# Patient Record
Sex: Female | Born: 1940 | Race: Black or African American | Hispanic: No | Marital: Married | Smoking: Never smoker
Health system: Southern US, Community
[De-identification: ages and names within clinical notes are randomized; demographics above are authoritative.]

## PROBLEM LIST (undated history)

## (undated) DIAGNOSIS — E669 Obesity, unspecified: Secondary | ICD-10-CM

## (undated) DIAGNOSIS — R569 Unspecified convulsions: Secondary | ICD-10-CM

## (undated) DIAGNOSIS — E785 Hyperlipidemia, unspecified: Secondary | ICD-10-CM

## (undated) DIAGNOSIS — I10 Essential (primary) hypertension: Secondary | ICD-10-CM

## (undated) DIAGNOSIS — Z90710 Acquired absence of both cervix and uterus: Secondary | ICD-10-CM

## (undated) DIAGNOSIS — R011 Cardiac murmur, unspecified: Secondary | ICD-10-CM

## (undated) DIAGNOSIS — E0590 Thyrotoxicosis, unspecified without thyrotoxic crisis or storm: Secondary | ICD-10-CM

## (undated) DIAGNOSIS — I519 Heart disease, unspecified: Secondary | ICD-10-CM

## (undated) HISTORY — DX: Cardiac murmur, unspecified: R01.1

## (undated) HISTORY — PX: ABDOMINAL HYSTERECTOMY: SHX81

## (undated) HISTORY — DX: Heart disease, unspecified: I51.9

## (undated) HISTORY — DX: Acquired absence of both cervix and uterus: Z90.710

---

## 1998-03-05 ENCOUNTER — Other Ambulatory Visit: Admission: RE | Admit: 1998-03-05 | Discharge: 1998-03-05 | Payer: Self-pay | Admitting: Family Medicine

## 2000-04-06 ENCOUNTER — Ambulatory Visit (HOSPITAL_COMMUNITY): Admission: RE | Admit: 2000-04-06 | Discharge: 2000-04-06 | Payer: Self-pay | Admitting: *Deleted

## 2000-04-06 ENCOUNTER — Encounter: Payer: Self-pay | Admitting: *Deleted

## 2000-05-18 ENCOUNTER — Encounter (INDEPENDENT_AMBULATORY_CARE_PROVIDER_SITE_OTHER): Payer: Self-pay

## 2000-05-18 ENCOUNTER — Inpatient Hospital Stay (HOSPITAL_COMMUNITY): Admission: RE | Admit: 2000-05-18 | Discharge: 2000-05-21 | Payer: Self-pay | Admitting: *Deleted

## 2001-02-07 DIAGNOSIS — Z90710 Acquired absence of both cervix and uterus: Secondary | ICD-10-CM

## 2001-02-07 HISTORY — DX: Acquired absence of both cervix and uterus: Z90.710

## 2001-09-02 ENCOUNTER — Encounter: Payer: Self-pay | Admitting: Family Medicine

## 2001-09-02 ENCOUNTER — Inpatient Hospital Stay (HOSPITAL_COMMUNITY): Admission: EM | Admit: 2001-09-02 | Discharge: 2001-09-11 | Payer: Self-pay

## 2001-09-03 ENCOUNTER — Encounter: Payer: Self-pay | Admitting: Family Medicine

## 2001-10-25 ENCOUNTER — Encounter: Payer: Self-pay | Admitting: Neurology

## 2001-10-25 ENCOUNTER — Ambulatory Visit (HOSPITAL_COMMUNITY): Admission: RE | Admit: 2001-10-25 | Discharge: 2001-10-25 | Payer: Self-pay | Admitting: Neurology

## 2004-03-31 ENCOUNTER — Other Ambulatory Visit: Admission: RE | Admit: 2004-03-31 | Discharge: 2004-03-31 | Payer: Self-pay | Admitting: Family Medicine

## 2005-11-07 ENCOUNTER — Ambulatory Visit (HOSPITAL_COMMUNITY): Admission: RE | Admit: 2005-11-07 | Discharge: 2005-11-07 | Payer: Self-pay | Admitting: Family Medicine

## 2010-02-24 ENCOUNTER — Emergency Department (HOSPITAL_COMMUNITY)
Admission: EM | Admit: 2010-02-24 | Discharge: 2010-02-24 | Payer: Self-pay | Source: Home / Self Care | Admitting: Emergency Medicine

## 2010-04-12 ENCOUNTER — Emergency Department (HOSPITAL_COMMUNITY)
Admission: EM | Admit: 2010-04-12 | Discharge: 2010-04-12 | Disposition: A | Discharge status: Home / Self Care | Payer: Medicare Other | Attending: Emergency Medicine | Admitting: Emergency Medicine

## 2010-04-12 DIAGNOSIS — I10 Essential (primary) hypertension: Secondary | ICD-10-CM | POA: Insufficient documentation

## 2010-04-12 DIAGNOSIS — Z7982 Long term (current) use of aspirin: Secondary | ICD-10-CM | POA: Insufficient documentation

## 2010-04-12 DIAGNOSIS — L0231 Cutaneous abscess of buttock: Secondary | ICD-10-CM | POA: Insufficient documentation

## 2010-04-12 DIAGNOSIS — G40909 Epilepsy, unspecified, not intractable, without status epilepticus: Secondary | ICD-10-CM | POA: Insufficient documentation

## 2010-06-25 NOTE — Discharge Summary (Signed)
NAME:  Cassandra Jones, Cassandra Jones                          ACCOUNT NO.:  [id number]   MEDICAL RECORD NO.:  [id number]                   PATIENT TYPE:  INP   LOCATION:  4730                                 FACILITY:  [facility]   PHYSICIAN:  Lorelle Formosa, M.D.           DATE OF BIRTH:  [birth year]   DATE OF ADMISSION:  09/01/2001  DATE OF DISCHARGE:  09/11/2001                                 DISCHARGE SUMMARY   ADMISSION DIAGNOSES:  1. Syncopal episode.  2. Hypertension.  3. Hypokalemia.   DISCHARGE DIAGNOSES:  1. Central cord compression syndrome.  2. Hypertension.  3. Seizure prophylaxis after collapse.   CONDITION ON DISCHARGE:  Stable.   DISPOSITION:  The patient was discharged home.   CONSULTANTS:  Neurology, Marlan Palau, M.D.   DISCHARGE MEDICATIONS:  1. Dilantin 100 mg t.i.d.  2. Reserpine 0.25 mg daily.  3. Hydrochlorothiazide 25 mg daily.  4. Vicodin one q.i.d. p.r.n. pain.   FOLLOW-UP:  Follow up in one to two weeks in the office.   ACTIVITY:  The patient is not to work for at least one month until October 12, 2001, pending further disposition.   HISTORY OF PRESENT ILLNESS:  This patient is a 70 year old, black, married  woman who came into the emergency department after a spell where she fell in  her kitchen.  She did not slip in any water, but had a misstep.  She was not  witnessed to fall, but was heard by her husband, who assisted her up.  The  patient states that she was out for a few seconds.  Family members denied  loss of bowel or bladder function.  She did not have any lesions of her  tongue.  She did complain of some discomfort in her right arm.  She had no  had any fever, chills, shakes, or any other untoward type of occurrence.  The patient was functionally feeling fine just prior to that, but did notice  that during the previous week or so she had felt some strangeness.  The  patient thus was brought to the emergency room and had a witnessed  seizure  for which she was given Ativan.  A CT scan of the head was done with no  apparent findings.  The patient was not able to voluntarily move her upper  extremities and seemed to have quite a bit of pain in the proximal upper  extremities and shoulder areas with passive movement.  This was felt to be  due to the fall initially and was monitored.   PAST MEDICAL HISTORY:  Hypertension with the use of reserpine 0.25 mg and  hydrochlorothiazide 25 mg daily.   ALLERGIES:  She has no known allergies.   FAMILY HISTORY:  Unremarkable and as outlined in the admission history and  physical.  Other past medical history was unremarkable and as outlined in  the admission history and physical.  SOCIAL HISTORY:  Unremarkable and as outlined in the admission history and  physical.   REVIEW OF SYMPTOMS:  Unremarkable and as outlined in the admission history  and physical.   PHYSICAL EXAMINATION:  VITAL SIGNS:  Blood pressure 104/78, pulse 77,  respirations 20, temperature 97.2 degrees.  O2 saturations were initially  95% on room air.  HEIGHT:  5 feet 2 inches.  WEIGHT:  210.6 pounds.  GENERAL APPEARANCE:  The patient was somewhat somnolent, but easily  arousable and oriented.  HEENT:  PERRLA.  EOMs were intact.  Mucous membranes were moist.  NECK:  Supple without adenopathy, jugular venous distention ______.  LUNGS:  Clear to auscultation.  HEART:  Regular rate.  No murmur.  ABDOMEN:  Soft and flat.  No masses.  EXTREMITIES:  Rather impressive tenderness to palpation of her shoulders  bilaterally in the proximal regions and decreased range of motion secondary  to pain.  Pulses were symmetrical.  NEUROLOGIC:  Nonfocal.   LABORATORY DATA:  The CBC revealed a white count of 12.2, hemoglobin 12.7,  hematocrit 40, MCV 76.9, platelets 226,000, 79 neutrophils, 13 lymphs, 7  monocytes, and 1 eosinophil.  A repeat CBC revealed white count increased to  16.4 with a hemoglobin of 12.3, and  hematocrit 38.3.  The initial TSH and  blood gases were normal.  Chemistries were initially normals of potassium  3.0 and glucose 138.  The initial CK was 329 with CK-MB 5.4.  Repeat CKs  were not significantly remarkable, but about the same.  The phenytoin was  initially less than 2.5 with repeat 11.7 and 8.2.  The urinalysis had  leukocyte esterase with a few epithelial cells and 3-6 white blood count and  a few bacteria.  The glycosylated hemoglobin was 5.5.  The initial x-ray of  the right humerus revealed mild degenerative changes of the [facility] joint with no  definite acute bony abnormality.  CT of the head without contrast showed no  definite evidence of acute intracranial abnormality.  The initial CT  revealed no acute abnormality also with suggestion that acute infarct could  be missed for 24-48 hours.  Thus, it was repeated without change.  MRI of  the brain and cervical spine revealed central disk extrusion at C6-7 with  cord flattening.  The central diameter of the spinal canal measured between  8-9 mm.  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  Biventricular enlargement was suggested with early atrophy  mostly of the fourth ventricle.  No other significant small vessel disease,  acute infarction, or abnormal intracranial enhancement.   HOSPITAL COURSE:  The patient was admitted to the hospital and seen by a  neurologist, C. Lesia Sago, M.D.  Her course was monitored with some  perplexity regarding the cause of her shoulder pain and her failure to  resolve.  The patient was unable to move her upper extremities to raise her  arms with upper lateral weakness and pain.  This did not resolve with  analgesics and rest.  She was thus thought to have a component of central  cord compression and physical therapy was given.  The patient will have this continued on an outpatient  basis.  She was able to walk fine with no  ________ type of change.  She was placed on Dilantin as therapeutic levels  were ascertained.  She was  given monitoring of elevated CBGs, which were thought  to be due to Decadron.  The hemoglobin A1C suggested this was not due to diabetes as she had no  history of such.  The patient is thus discharged for outpatient management.  She did have an EKG done which was normal.                                               Lorelle Formosa, M.D.    WWM/MEDQ  D:  11/01/2001  T:  11/04/2001  Job:  [id number]

## 2010-06-25 NOTE — Op Note (Signed)
[facility]  Patient:    Cassandra, Jones                       MRN: [id number] Proc. Date: 05/18/00 Adm. Date:  [id number] Attending:  Deniece Ree                           Operative Report  PREOPERATIVE DIAGNOSES:       Leiomyomatous uteri; chronic uterine                               pain; possible adenomyosis; pelvic                               adhesions.  POSTOPERATIVE DIAGNOSES:      Leiomyomatous uteri, chronic uterine                               pain, possible adenomyosis, pelvic                               adhesions, pending pathology.  OPERATION:                    Total abdominal hysterectomy and                               bilateral salpingo-oophorectomy with                               lysis of adhesions.  SURGEON:                      Deniece Ree, M.D.  ASSISTANT:                    Kathreen Cosier, M.D.  ANESTHESIA:                   General.  ESTIMATED BLOOD LOSS:         300 cc.  DRAINS/TUBES:                 Foley to straight drainage.  CONDITION ON DISCHARGE:       The patient tolerated the procedure well and                               returned to the recovery room in satisfactory                               condition.  DESCRIPTION OF PROCEDURE:     The patient was taken to the operating room and prepped and draped in the usual fashion for abdominal surgery. A low Pfannenstiel skin incision was made which was carried down to the peritoneum. After entering the fascia, the fascia was excised bilaterally the extent of the incision. The midline was identified and the rectus muscles opened. The abdominal peritoneum was then entered in a vertical fashion using Metzenbaum scissors. Immediately upon entering the abdominal pelvic cavity,  a moderate amount of adhesions were present. These were bluntly and sharply dissected away. At this point the uterus was grasped and elevated, which appeared to  be approximately 8-10 weeks in size with myomas present. The left round ligament was then grasped and ligated in routine fashion using #1 chromic in a Heaney stitch. This was done likewise on the right side. The infundibulopelvic ligament was then doubly clamped and ligated again using #1 chromic in Heaney stitches. The uterine vessels were then located, doubly clamped, doubly ligated again using #1 chromic in Heaney stitches. Straight Kocher clamps were then used to grasp the cardinal and uterosacral ligaments. This was done likewise again using #1 chromic in Heaney stitches. The vaginal cuff was then entered and the uterus separated from the vaginal cuff using Satinsky scissors. Hemostasis was obtained. Richardson angled stitches were put in bilaterally for vaginal support, following which the cuff was closed utilizing #1 chromic in a running locking stitch. At this point reperitonealization was carried out using 2-0 chromic in a running stitch. Sponge, needle, lap and instrument counts were correct x 2. Hemostasis was present. Irrigation then was also carried out. At this point the abdominal peritoneum was closed using 2-0 chromic in a running stitch, followed by closure of the fascia using #1 Dexon in a running stitch. 4-0 Monocryl was then utilized for the skin in a closure utilizing a subcuticular stitch. At this point the procedure was terminated. The patient tolerated the procedure well and returned to the recovery room in satisfactory condition. DD:  05/18/00 TD:  05/18/00 Job: [id number]

## 2010-06-25 NOTE — Discharge Summary (Signed)
[facility]  Patient:    MIKELLA, LINSLEY                       MRN: [id number] Adm. Date:  [id number] Disc. Date: [id number] Attending:  Deniece Ree                           Discharge Summary  DISCHARGE DIAGNOSES:          Leiomyomata uteri and chronic uterine pain.  DISCHARGE OPERATION:          Total abdominal hysterectomy and bilateral salpingo-oophorectomy.  DISCHARGE SUMMARY:            Patient is a 70 year old female who had been evaluated on several occasions for chronic uterine pain.  The patient began having more severe chronic pain and without any help from her pain relievers. She was scheduled for a total abdominal hysterectomy and a bilateral salpingo-oophorectomy.  On the day of admission patient underwent a total abdominal hysterectomy and a bilateral salpingo-oophorectomy.  Postoperative this patient did very well without any complications and was discharged on the third postoperative day.  She was instructed on the possible complications and care following this type of surgery.  She was told to return to my office in four weeks for followup evaluation or to call me prior to that time should any problems arise.DD:  07/15/00 TD:  07/16/00 Job: [id number]

## 2010-06-25 NOTE — Procedures (Signed)
EEG NUMBER:  [id number]   HISTORY:  This is a 70 year old with history of seizures, who is having an  EMG done to evaluate for seizure activity.   PROCEDURE:  This is a routine EEG.   TECHNICAL DESCRIPTION:  Throughout this routine EEG, there is a posterior-  dominant rhythm of 10-Hz activity at 20-30 mV.  Background activity is  symmetric and mostly comprised of alpha-range activity at 15-40 mV.  With  photic stimulation, there is a symmetric photic driving response noted.  Hyperventilation does not produce any significant abnormalities.  The  patient does become drowsy, however, does not enter stage II sleep.  Throughout this record, there is no evidence of electrographic seizures or  interictal discharge activity.   IMPRESSION:  This routine EEG is within normal limits in the awake state.      Cassandra Jones. Nash Shearer, M.D.  Electronically Signed     EAV:[id number]  D:  11/07/2005 12:04:06  T:  11/08/2005 08:47:57  Job #:  [id number]

## 2010-06-25 NOTE — H&P (Signed)
[facility]  Patient:    Cassandra Jones, Cassandra Jones                       MRN: [id number] Adm. Date:  [id number] Attending:  Deniece Ree                         History and Physical  HISTORY:                      The patient is a 70 year old female who has been evaluated on several occasions by me for chronic uterine pain.  Patient indicates that she has chronic pain most of the time.  She has utilized pain relievers and also antibiotics for possible infections.  She also has leiomyomata uteri and at this time desires surgical intervention and is being admitted for a total abdominal hysterectomy and bilateral salpingo-oophorectomy.  PAST MEDICAL HISTORY:         Three vaginal deliveries and also a bilateral tubal ligation.  Medically the patient is physically fit, however, is on antihypertensive medication.  PHYSICAL EXAMINATION  GENERAL:                      Well-developed, well-nourished, slightly obese female in no acute distress.  HEENT:                        Within normal limits.  NECK:                         Supple.  BREASTS:                      Without masses, tenderness, or discharge.  LUNGS:                        Clear to percussion, auscultation.  HEART:                        Normal sinus rhythm without murmur, rub, or gallop.  ABDOMEN:                      Obese, however, benign.  EXTREMITIES:                  Within normal limits.  NEUROLOGIC:                   Within normal limits.  PELVIC:                       External genitalia, BUS is within normal limits. Vagina is clear.  Cervix is firm and nontender.  The uterus is approximately 10 weeks in size, several nodularities around it.  On palpation the uterus is moderately to severely tender.  The adnexa is benign.  DIAGNOSES:                    Leiomyomata uteri approximately 8-10 weeks size, chronic uterine pain, possibly pelvic adhesions.  PLAN:                          Total abdominal hysterectomy and bilateral salpingo-oophorectomy. DD:  05/18/00 TD:  05/18/00 Job: [id number]

## 2010-06-25 NOTE — Consult Note (Signed)
NAME:  Cassandra Jones, Cassandra Jones                          ACCOUNT NO.:  [id number]   MEDICAL RECORD NO.:  [id number]                   PATIENT TYPE:  INP   LOCATION:  4730                                 FACILITY:  [facility]   PHYSICIAN:  Marlan Palau, M.D.               DATE OF BIRTH:  1940-08-18   DATE OF CONSULTATION:  DATE OF DISCHARGE:                              NEUROLOGY CONSULTATION   HISTORY OF PRESENT ILLNESS:  The patient is a 70 year old right-handed black  female born 12/18/1940, with a history of obesity, hypertension, who  is admitted at this point with new-onset seizure events.  The patient had a  seizure event at home that was unwitnessed.  The patient's husband was  outside, heard a thump and came back in.  The patient had apparently fallen  across the table, hitting on her right arm.  The patient was brought to the  emergency room and had another witnessed seizure-type event in the emergency  room.  The patient underwent a CT scan of the brain that was unremarkable.  Due to significant arm pain, cervical and thoracic spine x-rays were done  which have shown no evidence of fracture.  The patient was admitted for  evaluation but continues to complain of some pain, possible weakness of both  arms.  The patient otherwise denies any problems with colon, bowels, or  bladder.  Denies any neck pain per se.  The patient recalls nothing of the  seizure-type events.  The patient denied any tongue biting or bowel or  bladder incontinence.  Neurology was asked to see this patient for further  evaluation.   PAST MEDICAL HISTORY:  1. History of seizure event on admission.  2. Obesity.  3. Hypertension.  4. Upper extremity weakness greater than lower extremity weakness.  Severe     soreness of both upper extremities.  5. History of hysterectomy.  6. History of hypercholesterolemia.  7. History of anemia, on iron replacement.   MEDICATIONS:  The patient has been treated with  reserpine 0.25 mg daily and  hydrochlorothiazide 25 mg a day prior to admission.   ALLERGIES:  No known allergies.   SOCIAL HISTORY:  Does not smoke or drink.  This patient is married.  Lives  in the [place] area.  Works for [employer].  Has three children who are  alive and well.   FAMILY HISTORY:  Mother died in childbirth.  Father died with hypertension.  Has 18 brothers and sisters.  One brother died with a cerebral aneurysm.  One brother died with cancer of the throat.  One brother died with  alcoholism.  One brother died following a motor vehicle accident.  One  sister died from childbirth.   REVIEW OF SYMPTOMS:  Notable for no fever or chills.  The patient denies  headache, visual field changes, shortness of breath, chest pains, nausea,  vomiting.  Denies prior  syncope or seizures.  Has had occasional  palpitations of the heart associated with coffee intake.   PHYSICAL EXAMINATION:  VITAL SIGNS:  Blood pressure is 104/60, heart rate is 62, respiratory rate  20, temperature afebrile.   GENERAL:  This patient is a moderately to markedly obese black female who is  alert and cooperative at the time of examination.   HEENT:  Head is atraumatic.  Eyes:  Pupils are equal, round, and reactive to  light.  Discs are flat bilaterally.   NECK:  Supple.  No carotid bruits noted.   LUNGS:  Clear.   CARDIOVASCULAR:  Distant heart sounds.  No obvious murmurs or rubs noted.   EXTREMITIES:  Without significant edema.   NEUROLOGIC:  Cranial nerves as above.  Facial symmetry is present.  The  patient has good sensation to the face, pinprick and soft touch bilaterally.  Has good strength to facial muscles and the muscles of head turning and  shoulder shrugging bilaterally.  Speech is well enunciated and not aphasic.  Motor testing reveals weakness primarily with the distal muscles of the arms  bilaterally with grip, abduction of the fingers and thumbs bilaterally.  Triceps muscles  seem to be a bit weak.  Biceps seem to be stronger.  The  patient, however, had a very difficult examination with severe s with even  passive movement of the arms and a lot of give-away-type weakness due to  pain.  The lower extremities appear to have some weakness with lifting the  legs up off the bed with proximal thighs.  Otherwise, strength in the lower  extremities is symmetric and normal.  The patient has absent triceps  reflexes bilaterally and ankle jerk reflexes bilaterally, otherwise has  normal reflexes.  Toes are neutral, downgoing bilaterally.  The patient is  unable to perform finger-nose-finger or toe-to-finger due to pain and/or  weakness.  Pinprick, soft touch, sensation of extremities is symmetric and  normal.   LABORATORY DATA:  Before urinalysis, the specific gravity was 1.015, pH 6.0,  3-6 white cells.  CK level 329, troponin I 0.02.  White count of 12.2,  hemoglobin of 12.7, platelets 262, MCV of 76.9.  Sodium 136, potassium 3.9,  chloride of 102, BUN of 15,  glucose of 138.   IMPRESSION:  1. History of new-onset seizure, etiology unclear.  2. Bilateral upper extremity pain, possible weakness.  Also some weakness     involving the lower extremities, rule out central cord syndrome.  3. Cervical spine evaluation shows no fracture.  The patient will need to be     evaluated, however, for ruptured cervical disk with central cord     syndrome.  Will pursue further workup at this point.   PLAN:  1. MRI scan of the cervical spine on an urgent basis tonight.  2. Decadron therapy.  3. Cervical collar.  4. MRI scan of the brain in the a.m. with and without gadolinium.  5. EEG study.  6.     Initiate Dilantin therapy.  Cannot afford to have a seizure if cervical     spine disease is present.  7. Will follow up with muscle enzymes level in a.m.  8. Will follow the patient's clinical course while in house.  Marlan Palau,  M.D.    CKW/MEDQ  D:  09/03/2001  T:  09/08/2001  Job:  [id number]   cc:   Lorelle Formosa, M.D.

## 2014-04-28 DIAGNOSIS — I10 Essential (primary) hypertension: Secondary | ICD-10-CM | POA: Diagnosis not present

## 2014-04-28 DIAGNOSIS — G40009 Localization-related (focal) (partial) idiopathic epilepsy and epileptic syndromes with seizures of localized onset, not intractable, without status epilepticus: Secondary | ICD-10-CM | POA: Diagnosis not present

## 2014-05-22 DIAGNOSIS — Z1211 Encounter for screening for malignant neoplasm of colon: Secondary | ICD-10-CM | POA: Diagnosis not present

## 2014-05-22 DIAGNOSIS — K5900 Constipation, unspecified: Secondary | ICD-10-CM | POA: Diagnosis not present

## 2014-07-03 ENCOUNTER — Emergency Department (HOSPITAL_COMMUNITY)
Admission: EM | Admit: 2014-07-03 | Discharge: 2014-07-03 | Disposition: A | Discharge status: Home / Self Care | Payer: Commercial Managed Care - HMO | Attending: Emergency Medicine | Admitting: Emergency Medicine

## 2014-07-03 ENCOUNTER — Encounter (HOSPITAL_COMMUNITY): Payer: Self-pay | Admitting: Emergency Medicine

## 2014-07-03 DIAGNOSIS — L03221 Cellulitis of neck: Secondary | ICD-10-CM

## 2014-07-03 DIAGNOSIS — Z8639 Personal history of other endocrine, nutritional and metabolic disease: Secondary | ICD-10-CM | POA: Insufficient documentation

## 2014-07-03 DIAGNOSIS — I10 Essential (primary) hypertension: Secondary | ICD-10-CM | POA: Diagnosis not present

## 2014-07-03 DIAGNOSIS — L0211 Cutaneous abscess of neck: Secondary | ICD-10-CM | POA: Diagnosis present

## 2014-07-03 HISTORY — DX: Hyperlipidemia, unspecified: E78.5

## 2014-07-03 HISTORY — DX: Essential (primary) hypertension: I10

## 2014-07-03 HISTORY — DX: Unspecified convulsions: R56.9

## 2014-07-03 MED ORDER — CEPHALEXIN 250 MG PO CAPS
500.0000 mg | ORAL_CAPSULE | Freq: Once | ORAL | Status: AC
  Administered 2014-07-03: 500 mg via ORAL
  Filled 2014-07-03: qty 2

## 2014-07-03 MED ORDER — CEPHALEXIN 500 MG PO CAPS: 500.0000 mg | ORAL_CAPSULE | Freq: Two times a day (BID) | ORAL | Status: DC

## 2014-07-03 MED ORDER — LIDOCAINE HCL (PF) 1 % IJ SOLN: 5.0000 mL | Freq: Once | INTRAMUSCULAR | Status: DC

## 2014-07-03 NOTE — Discharge Instructions (Signed)
Take Keflex as directed until gone. Return to the ED with worsening or concerning symptoms. Refer to attached documents for more information.

## 2014-07-03 NOTE — ED Notes (Signed)
Pt reports lump on the back of her neck- area appears raised and red. sts area is sore. Denies drainage, fevers, chills.

## 2014-07-03 NOTE — ED Notes (Signed)
Pt has been discharged. Unable to d/c from system at this time.

## 2014-07-03 NOTE — ED Provider Notes (Signed)
CSN: [id number]     Arrival date & time 07/03/14  1116 History  This chart was scribed for non-physician practitioner, Emilia Beck, PA-C, working with Jerelyn Scott, MD, by Lionel December, ED Scribe. This patient was seen in room TR09C/TR09C and the patient's care was started at 12:00 PM.   Chief Complaint  Patient presents with  . Abscess     (Consider location/radiation/quality/duration/timing/severity/associated sxs/prior Treatment) HPI  HPI Comments: Cassandra Jones is a 74 y.o. female who presents to the Emergency Department complaining of an abscess on her neck.  She states that the lump is sore and denies any drainage from the area.  Patient has abscesses frequently. She denies any fevers/chills. She has no other questions or concerns today.    Past Medical History  Diagnosis Date  . Hypertension   . Hyperlipemia   . Seizures    Past Surgical History  Procedure Laterality Date  . Abdominal hysterectomy     No family history on file. History  Substance Use Topics  . Smoking status: Never Smoker   . Smokeless tobacco: Not on file  . Alcohol Use: No   OB History    No data available     Review of Systems  Constitutional: Negative for fever and chills.  Gastrointestinal: Negative for nausea and vomiting.  Musculoskeletal: Negative for neck pain and neck stiffness.  Skin: Positive for wound. Negative for rash.  All other systems reviewed and are negative.     Allergies  Review of patient's allergies indicates no known allergies.  Home Medications   Prior to Admission medications   Not on File   BP 151/87 mmHg  Pulse 82  Temp(Src) 98.5 F (36.9 C) (Oral)  Resp 16  Ht 5\' 2"  (1.575 m)  Wt 199 lb 14.4 oz (90.674 kg)  BMI 36.55 kg/m2  SpO2 97% Physical Exam  Constitutional: She is oriented to person, place, and time. She appears well-developed and well-nourished. No distress.  HENT:  Head: Normocephalic and atraumatic.  Eyes: Conjunctivae and EOM  are normal.  Neck: Normal range of motion. Neck supple.  Cardiovascular: Normal rate.   Pulmonary/Chest: Effort normal. No respiratory distress.  Abdominal: Soft. She exhibits no distension. There is no tenderness. There is no rebound.  Musculoskeletal: Normal range of motion.  Neurological: She is alert and oriented to person, place, and time.  Skin: Skin is warm and dry.  4x2cm area of erythema, swelling and induration of the nape of neck. No fluctuance or drainage noted.   Psychiatric: She has a normal mood and affect. Her behavior is normal.  Nursing note and vitals reviewed.   ED Course  Procedures (including critical care time) DIAGNOSTIC STUDIES: Oxygen Saturation is 97% on RA, normal by my interpretation.    COORDINATION OF CARE: 12:03 PM Discussed treatment plan with patient at beside, the patient agrees with the plan and has no further questions at this time.   Labs Review Labs Reviewed - No data to display  Imaging Review No results found.   EKG Interpretation None      MDM   Final diagnoses:  Cellulitis of neck    12:10 PM Patient's infected area of the nape of the neck swelling. No fluctuance to suggest abscess. Patient will be treated with PO keflex and instructed to return with worsening or concerning symptoms.   I personally performed the services described in this documentation, which was scribed in my presence. The recorded information has been reviewed and is accurate.  Emilia Beck, PA-C 07/03/14 1213  Jerelyn Scott, MD 07/03/14 1215

## 2014-07-10 DIAGNOSIS — Z0000 Encounter for general adult medical examination without abnormal findings: Secondary | ICD-10-CM | POA: Diagnosis not present

## 2014-09-17 DIAGNOSIS — G40901 Epilepsy, unspecified, not intractable, with status epilepticus: Secondary | ICD-10-CM | POA: Diagnosis not present

## 2014-09-17 DIAGNOSIS — I10 Essential (primary) hypertension: Secondary | ICD-10-CM | POA: Diagnosis not present

## 2014-09-17 DIAGNOSIS — G40909 Epilepsy, unspecified, not intractable, without status epilepticus: Secondary | ICD-10-CM | POA: Diagnosis not present

## 2014-09-17 DIAGNOSIS — L259 Unspecified contact dermatitis, unspecified cause: Secondary | ICD-10-CM | POA: Diagnosis not present

## 2014-10-31 DIAGNOSIS — H524 Presbyopia: Secondary | ICD-10-CM | POA: Diagnosis not present

## 2014-10-31 DIAGNOSIS — H5203 Hypermetropia, bilateral: Secondary | ICD-10-CM | POA: Diagnosis not present

## 2014-10-31 DIAGNOSIS — H11423 Conjunctival edema, bilateral: Secondary | ICD-10-CM | POA: Diagnosis not present

## 2014-10-31 DIAGNOSIS — H11153 Pinguecula, bilateral: Secondary | ICD-10-CM | POA: Diagnosis not present

## 2014-10-31 DIAGNOSIS — H18413 Arcus senilis, bilateral: Secondary | ICD-10-CM | POA: Diagnosis not present

## 2014-10-31 DIAGNOSIS — H3531 Nonexudative age-related macular degeneration: Secondary | ICD-10-CM | POA: Diagnosis not present

## 2014-10-31 DIAGNOSIS — H2513 Age-related nuclear cataract, bilateral: Secondary | ICD-10-CM | POA: Diagnosis not present

## 2014-10-31 DIAGNOSIS — H52223 Regular astigmatism, bilateral: Secondary | ICD-10-CM | POA: Diagnosis not present

## 2014-11-13 DIAGNOSIS — Z23 Encounter for immunization: Secondary | ICD-10-CM | POA: Diagnosis not present

## 2014-12-08 DIAGNOSIS — N390 Urinary tract infection, site not specified: Secondary | ICD-10-CM | POA: Diagnosis not present

## 2014-12-08 DIAGNOSIS — Z6837 Body mass index (BMI) 37.0-37.9, adult: Secondary | ICD-10-CM | POA: Diagnosis not present

## 2015-01-20 DIAGNOSIS — I10 Essential (primary) hypertension: Secondary | ICD-10-CM | POA: Diagnosis not present

## 2015-01-20 DIAGNOSIS — G40009 Localization-related (focal) (partial) idiopathic epilepsy and epileptic syndromes with seizures of localized onset, not intractable, without status epilepticus: Secondary | ICD-10-CM | POA: Diagnosis not present

## 2015-01-20 DIAGNOSIS — G4089 Other seizures: Secondary | ICD-10-CM | POA: Diagnosis not present

## 2015-03-07 ENCOUNTER — Encounter (HOSPITAL_COMMUNITY): Payer: Self-pay | Admitting: *Deleted

## 2015-03-07 ENCOUNTER — Emergency Department (HOSPITAL_COMMUNITY): Payer: Commercial Managed Care - HMO

## 2015-03-07 ENCOUNTER — Emergency Department (HOSPITAL_COMMUNITY)
Admission: EM | Admit: 2015-03-07 | Discharge: 2015-03-07 | Disposition: A | Discharge status: Home / Self Care | Payer: Commercial Managed Care - HMO | Attending: Emergency Medicine | Admitting: Emergency Medicine

## 2015-03-07 DIAGNOSIS — R21 Rash and other nonspecific skin eruption: Secondary | ICD-10-CM | POA: Diagnosis not present

## 2015-03-07 DIAGNOSIS — I10 Essential (primary) hypertension: Secondary | ICD-10-CM | POA: Insufficient documentation

## 2015-03-07 DIAGNOSIS — M25562 Pain in left knee: Secondary | ICD-10-CM | POA: Insufficient documentation

## 2015-03-07 DIAGNOSIS — Z792 Long term (current) use of antibiotics: Secondary | ICD-10-CM | POA: Diagnosis not present

## 2015-03-07 DIAGNOSIS — E669 Obesity, unspecified: Secondary | ICD-10-CM | POA: Insufficient documentation

## 2015-03-07 HISTORY — DX: Obesity, unspecified: E66.9

## 2015-03-07 MED ORDER — HYDROCODONE-ACETAMINOPHEN 5-325 MG PO TABS: 1.0000 | ORAL_TABLET | ORAL | Status: DC | PRN

## 2015-03-07 MED ORDER — HYDROCODONE-ACETAMINOPHEN 5-325 MG PO TABS
1.0000 | ORAL_TABLET | Freq: Once | ORAL | Status: AC
  Administered 2015-03-07: 1 via ORAL
  Filled 2015-03-07: qty 1

## 2015-03-07 MED ORDER — NAPROXEN 375 MG PO TABS: 375.0000 mg | ORAL_TABLET | Freq: Two times a day (BID) | ORAL | Status: DC

## 2015-03-07 NOTE — ED Notes (Signed)
Pt reports waking up this am with pain to left leg and knee, difficulty bearing weight. Denies any injury.

## 2015-03-07 NOTE — ED Provider Notes (Signed)
CSN: [id number]     Arrival date & time 03/07/15  1313 History   First MD Initiated Contact with Patient 03/07/15 1357     Chief Complaint  Patient presents with  . Leg Pain     (Consider location/radiation/quality/duration/timing/severity/associated sxs/prior Treatment) HPI Patient has sudden onset of pain this morning when getting out of bed. She went to bed with no pain and this morning when she stepped out of bed she had a lot of pain in her left knee. There is not swelling or redness. If she is not putting weight on it is not very painful. He has not had any injury that she is aware of. There are no associated symptoms. Patient has notable plaque lesions on her lower extremities. She has had these lesions for a number of years. She just calls them a rash. He reports he puts lotions and creams on that helped. She does reports she's been seen by a dermatologist. I inquired if she had been diagnosed with psoriasis. She wasn't sure but it sounded familiar. Been no change to the quality and severity of these lesions. Past Medical History  Diagnosis Date  . Hypertension   . Hyperlipemia   . Seizures (HCC)   . Obesity    Past Surgical History  Procedure Laterality Date  . Abdominal hysterectomy     History reviewed. No pertinent family history. Social History  Substance Use Topics  . Smoking status: Never Smoker   . Smokeless tobacco: None  . Alcohol Use: No   OB History    No data available     Review of Systems 10 Systems reviewed and are negative for acute change except as noted in the HPI.    Allergies  Review of patient's allergies indicates no known allergies.  Home Medications   Prior to Admission medications   Medication Sig Start Date End Date Taking? Authorizing Provider  cephALEXin (KEFLEX) 500 MG capsule Take 1 capsule (500 mg total) by mouth 2 (two) times daily. 07/03/14   Emilia Beck, PA-C  HYDROcodone-acetaminophen (NORCO/VICODIN) 5-325 MG tablet Take  1-2 tablets by mouth every 4 (four) hours as needed for moderate pain or severe pain. 03/07/15   Arby Barrette, MD  naproxen (NAPROSYN) 375 MG tablet Take 1 tablet (375 mg total) by mouth 2 (two) times daily. 03/07/15   Arby Barrette, MD   BP 139/82 mmHg  Pulse 84  Temp(Src) 98.2 F (36.8 C) (Oral)  Resp 20  SpO2 97% Physical Exam  Constitutional: She is oriented to person, place, and time. She appears well-developed and well-nourished. No distress.  HENT:  Head: Normocephalic and atraumatic.  Eyes: EOM are normal.  Cardiovascular: Normal rate, regular rhythm, normal heart sounds and intact distal pulses.   Pulmonary/Chest: Effort normal and breath sounds normal.  Abdominal: Soft. There is no tenderness.  Musculoskeletal: Normal range of motion. She exhibits tenderness. She exhibits no edema.  Patient has some localizing tenderness along the medial patella. No joint effusion. Patient's range of motion is intact with passive range of motion. No calf or foot tenderness. See image for thickened plaque lesions that are chronic. I could would only accept one image. There are also round slightly scaling lesions approximately 6 cm that are higher on the high. Her also some scaling plaque lesions on her elbows.  Neurological: She is alert and oriented to person, place, and time. She exhibits normal muscle tone. Coordination normal.  Skin: Skin is warm and dry. Rash noted.  Psychiatric: She has a  normal mood and affect.        ED Course  Procedures (including critical care time) Labs Review Labs Reviewed - No data to display  Imaging Review Dg Knee Complete 4 Views Left  03/07/2015  CLINICAL DATA:  Woke up this morning with knee pain. Ear pain bearing weight. EXAM: LEFT KNEE - COMPLETE 4+ VIEW COMPARISON:  none. FINDINGS: Mild to moderate degenerative changes in the left knee, most pronounced in the medial and patellofemoral compartments. Trace joint effusion. No acute bony abnormality.  Specifically, no fracture, subluxation, or dislocation. Soft tissues are intact. IMPRESSION: Degenerative changes.  No acute findings. Electronically Signed   By: Charlett Nose M.D.   On: 03/07/2015 14:52   I have personally reviewed and evaluated these images and lab results as part of my medical decision-making.   EKG Interpretation None      MDM   Final diagnoses:  Knee pain, acute, left   Patient has knee pain with no evidence of acute injury. There is no erythema or swelling to suggest joint infection. She has no associated constitutional symptoms. Suspect overuse syndrome type pain. She should also has rash that is suggestive of psoriasis. Psoriatic arthritis is also a consideration. She'll be given naproxen and Vicodin to use for pain. Patient is counseled to follow up with her family doctor next week to clarify the nature of her rash which has been seen by dermatology and further treatment for the knee if needed. She is counseled on need to return if there should be redness, swelling, fever or significant worsening pain    Arby Barrette, MD 03/07/15 1517

## 2015-03-07 NOTE — Discharge Instructions (Signed)
Joint Pain Joint pain, which is also called arthralgia, can be caused by many things. Joint pain often goes away when you follow your health care provider's instructions for relieving pain at home. However, joint pain can also be caused by conditions that require further treatment. Common causes of joint pain include:  Bruising in the area of the joint.  Overuse of the joint.  Wear and tear on the joints that occur with aging (osteoarthritis).  Various other forms of arthritis.  A buildup of a crystal form of uric acid in the joint (gout).  Infections of the joint (septic arthritis) or of the bone (osteomyelitis). Your health care provider may recommend medicine to help with the pain. If your joint pain continues, additional tests may be needed to diagnose your condition. HOME CARE INSTRUCTIONS Watch your condition for any changes. Follow these instructions as directed to lessen the pain that you are feeling.  Take medicines only as directed by your health care provider.  Rest the affected area for as long as your health care provider says that you should. If directed to do so, raise the painful joint above the level of your heart while you are sitting or lying down.  Do not do things that cause or worsen pain.  If directed, apply ice to the painful area:  Put ice in a plastic bag.  Place a towel between your skin and the bag.  Leave the ice on for 20 minutes, 2-3 times per day.  Wear an elastic bandage, splint, or sling as directed by your health care provider. Loosen the elastic bandage or splint if your fingers or toes become numb and tingle, or if they turn cold and blue.  Begin exercising or stretching the affected area as directed by your health care provider. Ask your health care provider what types of exercise are safe for you.  Keep all follow-up visits as directed by your health care provider. This is important. SEEK MEDICAL CARE IF:  Your pain increases, and medicine  does not help.  Your joint pain does not improve within 3 days.  You have increased bruising or swelling.  You have a fever.  You lose 10 lb (4.5 kg) or more without trying. SEEK IMMEDIATE MEDICAL CARE IF:  You are not able to move the joint.  Your fingers or toes become numb or they turn cold and blue.   This information is not intended to replace advice given to you by your health care provider. Make sure you discuss any questions you have with your health care provider.   Document Released: 01/24/2005 Document Revised: 02/14/2014 Document Reviewed: 11/05/2013 Elsevier Interactive Patient Education 2016 [employer] Suspected Psoriasis Psoriasis is a long-term (chronic) condition of skin inflammation. It occurs because your immune system causes skin cells to form too quickly. As a result, too many skin cells grow and create raised, red patches (plaques) that look silvery on your skin. Plaques may appear anywhere on your body. They can be any size or shape. Psoriasis can come and go. The condition varies from mild to very severe. It cannot be passed from one person to another (not contagious).  CAUSES  The cause of psoriasis is not known, but certain factors can make the condition worse. These include:   Damage or trauma to the skin, such as cuts, scrapes, sunburn, and dryness.  Lack of sunlight.  Certain medicines.  Alcohol.  Tobacco use.  Stress.  Infections caused by bacteria or viruses. RISK FACTORS This condition  is more likely to develop in:  People with a family history of psoriasis.  People who are Caucasian.  People who are between the ages of 15-46 and 77-23 years old. SYMPTOMS  There are five different types of psoriasis. You can have more than one type of psoriasis during your life. Types are:   Plaque.  Guttate.  Inverse.  Pustular.  Erythrodermic. Each type of psoriasis has different symptoms.   Plaque psoriasis symptoms include red, raised  plaques with a silvery white coating (scale). These plaques may be itchy. Your nails may be pitted and crumbly or fall off.  Guttate psoriasis symptoms include small red spots that often show up on your trunk, arms, and legs. These spots may develop after you have been sick, especially with strep throat.  Inverse psoriasis symptoms include plaques in your underarm area, under your breasts, or on your genitals, groin, or buttocks.  Pustular psoriasis symptoms include pus-filled bumps that are painful, red, and swollen on the palms of your hands or the soles of your feet. You also may feel exhausted, feverish, weak, or have no appetite.  Erythrodermic psoriasis symptoms include bright red skin that may look burned. You may have a fast heartbeat and a body temperature that is too high or too low. You may be itchy or in pain. DIAGNOSIS  Your health care provider may suspect psoriasis based on your symptoms and family history. Your health care provider will also do a physical exam. This may include a procedure to remove a tissue sample (biopsy) for testing. You may also be referred to a health care provider who specializes in skin diseases (dermatologist).  TREATMENT There is no cure for this condition, but treatment can help manage it. Goals of treatment include:   Helping your skin heal.  Reducing itching and inflammation.  Slowing the growth of new skin cells.  Helping your immune system respond better to your skin. Treatment varies, depending on the severity of your condition. Treatment may include:   Creams or ointments.  Ultraviolet ray exposure (light therapy). This may include natural sunlight or light therapy in a medical office.  Medicines (systemic therapy). These medicines can help your body better manage skin cell turnover and inflammation. They may be used along with light therapy or ointments. You may also get antibiotic medicines if you have an infection. HOME CARE  INSTRUCTIONS Skin Care  Moisturize your skin as needed. Only use moisturizers that have been approved by your health care provider.   Apply cool compresses to the affected areas.   Do not scratch your skin.  Lifestyle  Do not use tobacco products. This includes cigarettes, chewing tobacco, and e-cigarettes. If you need help quitting, ask your health care provider.  Drink little or no alcohol.   Try techniques for stress reduction, such as meditation or yoga.  Get exposure to the sun as told by your health care provider. Do not get sunburned.   Consider joining a psoriasis support group.  Medicines  Take or use over-the-counter and prescription medicines only as told by your health care provider.  If you were prescribed an antibiotic, take or use it as told by your health care provider. Do not stop taking the antibiotic even if your condition starts to improve. General Instructions  Keep a journal to help track what triggers an outbreak. Try to avoid any triggers.   See a counselor or social worker if feelings of sadness, frustration, and hopelessness about your condition are interfering with your  work and relationships.  Keep all follow-up visits as told by your health care provider. This is important. SEEK MEDICAL CARE IF:  Your pain gets worse.  You have increasing redness or warmth in the affected areas.   You have new or worsening pain or stiffness in your joints.  Your nails start to break easily or pull away from the nail bed.   You have a fever.   You feel depressed.   This information is not intended to replace advice given to you by your health care provider. Make sure you discuss any questions you have with your health care provider.   Document Released: 01/22/2000 Document Revised: 10/15/2014 Document Reviewed: 06/11/2014 Elsevier Interactive Patient Education [employer]

## 2015-03-07 NOTE — ED Notes (Signed)
Patient transported to X-ray 

## 2015-03-07 NOTE — ED Notes (Signed)
Pt verbalized understanding of d/c instructions, prescriptions, and follow-up care. No further questions/concerns, VSS, assisted to lobby in wheelchair.  

## 2015-03-11 DIAGNOSIS — M25562 Pain in left knee: Secondary | ICD-10-CM | POA: Diagnosis not present

## 2015-03-25 DIAGNOSIS — M25562 Pain in left knee: Secondary | ICD-10-CM | POA: Diagnosis not present

## 2015-06-02 DIAGNOSIS — I10 Essential (primary) hypertension: Secondary | ICD-10-CM | POA: Diagnosis not present

## 2015-06-02 DIAGNOSIS — G40909 Epilepsy, unspecified, not intractable, without status epilepticus: Secondary | ICD-10-CM | POA: Diagnosis not present

## 2015-06-02 DIAGNOSIS — R21 Rash and other nonspecific skin eruption: Secondary | ICD-10-CM | POA: Diagnosis not present

## 2015-06-02 DIAGNOSIS — D509 Iron deficiency anemia, unspecified: Secondary | ICD-10-CM | POA: Diagnosis not present

## 2015-06-02 DIAGNOSIS — Z1239 Encounter for other screening for malignant neoplasm of breast: Secondary | ICD-10-CM | POA: Diagnosis not present

## 2015-06-02 DIAGNOSIS — R946 Abnormal results of thyroid function studies: Secondary | ICD-10-CM | POA: Diagnosis not present

## 2015-06-02 DIAGNOSIS — R7301 Impaired fasting glucose: Secondary | ICD-10-CM | POA: Diagnosis not present

## 2015-06-02 DIAGNOSIS — Z79899 Other long term (current) drug therapy: Secondary | ICD-10-CM | POA: Diagnosis not present

## 2015-06-02 DIAGNOSIS — E785 Hyperlipidemia, unspecified: Secondary | ICD-10-CM | POA: Diagnosis not present

## 2015-06-04 ENCOUNTER — Other Ambulatory Visit: Payer: Self-pay | Admitting: Family Medicine

## 2015-06-04 DIAGNOSIS — Z1231 Encounter for screening mammogram for malignant neoplasm of breast: Secondary | ICD-10-CM

## 2015-06-10 DIAGNOSIS — L438 Other lichen planus: Secondary | ICD-10-CM | POA: Diagnosis not present

## 2015-06-22 ENCOUNTER — Ambulatory Visit
Admission: RE | Admit: 2015-06-22 | Discharge: 2015-06-22 | Disposition: A | Discharge status: Home / Self Care | Payer: Commercial Managed Care - HMO | Source: Ambulatory Visit | Attending: Family Medicine | Admitting: Family Medicine

## 2015-06-22 DIAGNOSIS — Z1231 Encounter for screening mammogram for malignant neoplasm of breast: Secondary | ICD-10-CM | POA: Diagnosis not present

## 2015-09-17 DIAGNOSIS — Z23 Encounter for immunization: Secondary | ICD-10-CM | POA: Diagnosis not present

## 2015-09-17 DIAGNOSIS — R946 Abnormal results of thyroid function studies: Secondary | ICD-10-CM | POA: Diagnosis not present

## 2015-09-17 DIAGNOSIS — E785 Hyperlipidemia, unspecified: Secondary | ICD-10-CM | POA: Diagnosis not present

## 2015-09-17 DIAGNOSIS — Z79899 Other long term (current) drug therapy: Secondary | ICD-10-CM | POA: Diagnosis not present

## 2015-09-17 DIAGNOSIS — I10 Essential (primary) hypertension: Secondary | ICD-10-CM | POA: Diagnosis not present

## 2015-09-17 DIAGNOSIS — N951 Menopausal and female climacteric states: Secondary | ICD-10-CM | POA: Diagnosis not present

## 2015-09-17 DIAGNOSIS — L439 Lichen planus, unspecified: Secondary | ICD-10-CM | POA: Diagnosis not present

## 2015-09-17 DIAGNOSIS — G40909 Epilepsy, unspecified, not intractable, without status epilepticus: Secondary | ICD-10-CM | POA: Diagnosis not present

## 2015-10-19 DIAGNOSIS — Z780 Asymptomatic menopausal state: Secondary | ICD-10-CM | POA: Diagnosis not present

## 2015-10-21 DIAGNOSIS — Z23 Encounter for immunization: Secondary | ICD-10-CM | POA: Diagnosis not present

## 2015-11-03 DIAGNOSIS — H52223 Regular astigmatism, bilateral: Secondary | ICD-10-CM | POA: Diagnosis not present

## 2015-11-03 DIAGNOSIS — H2513 Age-related nuclear cataract, bilateral: Secondary | ICD-10-CM | POA: Diagnosis not present

## 2015-11-03 DIAGNOSIS — H353122 Nonexudative age-related macular degeneration, left eye, intermediate dry stage: Secondary | ICD-10-CM | POA: Diagnosis not present

## 2015-11-03 DIAGNOSIS — H25013 Cortical age-related cataract, bilateral: Secondary | ICD-10-CM | POA: Diagnosis not present

## 2015-11-03 DIAGNOSIS — H5203 Hypermetropia, bilateral: Secondary | ICD-10-CM | POA: Diagnosis not present

## 2015-11-03 DIAGNOSIS — I10 Essential (primary) hypertension: Secondary | ICD-10-CM | POA: Diagnosis not present

## 2015-11-03 DIAGNOSIS — H35033 Hypertensive retinopathy, bilateral: Secondary | ICD-10-CM | POA: Diagnosis not present

## 2015-11-03 DIAGNOSIS — H04123 Dry eye syndrome of bilateral lacrimal glands: Secondary | ICD-10-CM | POA: Diagnosis not present

## 2015-11-03 DIAGNOSIS — H353111 Nonexudative age-related macular degeneration, right eye, early dry stage: Secondary | ICD-10-CM | POA: Diagnosis not present

## 2015-11-19 DIAGNOSIS — L0211 Cutaneous abscess of neck: Secondary | ICD-10-CM | POA: Diagnosis not present

## 2015-11-30 DIAGNOSIS — L0211 Cutaneous abscess of neck: Secondary | ICD-10-CM | POA: Diagnosis not present

## 2016-03-31 DIAGNOSIS — Z23 Encounter for immunization: Secondary | ICD-10-CM | POA: Diagnosis not present

## 2016-03-31 DIAGNOSIS — R946 Abnormal results of thyroid function studies: Secondary | ICD-10-CM | POA: Diagnosis not present

## 2016-03-31 DIAGNOSIS — Z1211 Encounter for screening for malignant neoplasm of colon: Secondary | ICD-10-CM | POA: Diagnosis not present

## 2016-03-31 DIAGNOSIS — E6609 Other obesity due to excess calories: Secondary | ICD-10-CM | POA: Diagnosis not present

## 2016-03-31 DIAGNOSIS — I10 Essential (primary) hypertension: Secondary | ICD-10-CM | POA: Diagnosis not present

## 2016-03-31 DIAGNOSIS — E785 Hyperlipidemia, unspecified: Secondary | ICD-10-CM | POA: Diagnosis not present

## 2016-03-31 DIAGNOSIS — G40909 Epilepsy, unspecified, not intractable, without status epilepticus: Secondary | ICD-10-CM | POA: Diagnosis not present

## 2016-03-31 DIAGNOSIS — Z0000 Encounter for general adult medical examination without abnormal findings: Secondary | ICD-10-CM | POA: Diagnosis not present

## 2016-03-31 DIAGNOSIS — R7301 Impaired fasting glucose: Secondary | ICD-10-CM | POA: Diagnosis not present

## 2016-03-31 DIAGNOSIS — L439 Lichen planus, unspecified: Secondary | ICD-10-CM | POA: Diagnosis not present

## 2016-04-22 DIAGNOSIS — Z1211 Encounter for screening for malignant neoplasm of colon: Secondary | ICD-10-CM | POA: Diagnosis not present

## 2016-04-25 DIAGNOSIS — L438 Other lichen planus: Secondary | ICD-10-CM | POA: Diagnosis not present

## 2016-05-16 DIAGNOSIS — Z79899 Other long term (current) drug therapy: Secondary | ICD-10-CM | POA: Diagnosis not present

## 2016-05-16 DIAGNOSIS — E785 Hyperlipidemia, unspecified: Secondary | ICD-10-CM | POA: Diagnosis not present

## 2016-06-29 DIAGNOSIS — R946 Abnormal results of thyroid function studies: Secondary | ICD-10-CM | POA: Diagnosis not present

## 2016-09-28 DIAGNOSIS — G40909 Epilepsy, unspecified, not intractable, without status epilepticus: Secondary | ICD-10-CM | POA: Diagnosis not present

## 2016-09-28 DIAGNOSIS — E6609 Other obesity due to excess calories: Secondary | ICD-10-CM | POA: Diagnosis not present

## 2016-09-28 DIAGNOSIS — Z23 Encounter for immunization: Secondary | ICD-10-CM | POA: Diagnosis not present

## 2016-09-28 DIAGNOSIS — E785 Hyperlipidemia, unspecified: Secondary | ICD-10-CM | POA: Diagnosis not present

## 2016-09-28 DIAGNOSIS — R946 Abnormal results of thyroid function studies: Secondary | ICD-10-CM | POA: Diagnosis not present

## 2016-09-28 DIAGNOSIS — I10 Essential (primary) hypertension: Secondary | ICD-10-CM | POA: Diagnosis not present

## 2016-11-02 DIAGNOSIS — H11153 Pinguecula, bilateral: Secondary | ICD-10-CM | POA: Diagnosis not present

## 2016-11-02 DIAGNOSIS — H2513 Age-related nuclear cataract, bilateral: Secondary | ICD-10-CM | POA: Diagnosis not present

## 2016-11-02 DIAGNOSIS — H43813 Vitreous degeneration, bilateral: Secondary | ICD-10-CM | POA: Diagnosis not present

## 2016-11-02 DIAGNOSIS — H3530 Unspecified macular degeneration: Secondary | ICD-10-CM | POA: Diagnosis not present

## 2016-11-02 DIAGNOSIS — H18413 Arcus senilis, bilateral: Secondary | ICD-10-CM | POA: Diagnosis not present

## 2016-11-02 DIAGNOSIS — H353111 Nonexudative age-related macular degeneration, right eye, early dry stage: Secondary | ICD-10-CM | POA: Diagnosis not present

## 2016-11-02 DIAGNOSIS — H353220 Exudative age-related macular degeneration, left eye, stage unspecified: Secondary | ICD-10-CM | POA: Diagnosis not present

## 2016-11-02 DIAGNOSIS — H25013 Cortical age-related cataract, bilateral: Secondary | ICD-10-CM | POA: Diagnosis not present

## 2016-11-02 DIAGNOSIS — H11423 Conjunctival edema, bilateral: Secondary | ICD-10-CM | POA: Diagnosis not present

## 2016-11-07 ENCOUNTER — Encounter (INDEPENDENT_AMBULATORY_CARE_PROVIDER_SITE_OTHER): Payer: Self-pay | Admitting: Ophthalmology

## 2016-11-21 ENCOUNTER — Ambulatory Visit (INDEPENDENT_AMBULATORY_CARE_PROVIDER_SITE_OTHER): Payer: Commercial Managed Care - HMO | Admitting: Ophthalmology

## 2016-11-21 ENCOUNTER — Encounter (INDEPENDENT_AMBULATORY_CARE_PROVIDER_SITE_OTHER): Payer: Self-pay | Admitting: Ophthalmology

## 2016-11-21 DIAGNOSIS — H25813 Combined forms of age-related cataract, bilateral: Secondary | ICD-10-CM

## 2016-11-21 DIAGNOSIS — H353132 Nonexudative age-related macular degeneration, bilateral, intermediate dry stage: Secondary | ICD-10-CM | POA: Diagnosis not present

## 2016-11-21 DIAGNOSIS — H43813 Vitreous degeneration, bilateral: Secondary | ICD-10-CM

## 2016-11-21 NOTE — Progress Notes (Signed)
[facility] Note  11/21/2016     CHIEF COMPLAINT Patient presents for Retina Evaluation   HISTORY OF PRESENT ILLNESS: Cassandra Jones is a 76 y.o. female who presents to the clinic today for:   HPI    Retina Evaluation  In both eyes.  Associated Symptoms Floaters.  Negative for Flashes, Distortion, Redness, Trauma, Shoulder/Hip pain, Fatigue, Weight Loss, Fever, Scalp Tenderness, Blind Spot, Photophobia, Pain, Glare and Jaw Claudication.  Treatments tried include no treatments.  I, the attending physician,  performed the HPI with the patient and updated documentation appropriately.        Comments  Referral of DR. S Bernstorf Elevation for possible wet Mac. Degeneration OS. Patient states she has not noticed any issues with her vision. Denies eye pain, floaters or  Flashes. Patient is using eye lubricating gtts QD and she is taking Areds and multi vitamins QD.     Last edited by Bernarda Caffey, MD on 11/21/2016  2:52 PM. (History)      Referring physician: Calton Dach, MD [place], [place] [id number]  HISTORICAL INFORMATION:   Selected notes from the MEDICAL RECORD NUMBER Referral from Dr. Renaldo Harrison for concern of wet ARMD OS;  Ocular Hx- DES OU; cataract OU; currently taking AREDS 2;  PMH- HTN; seizures;    CURRENT MEDICATIONS: Current Outpatient Prescriptions (Ophthalmic Drugs)  Medication Sig  . Artificial Tear Ointment (EYE LUBRICANT OP) Apply to eye.   No current facility-administered medications for this visit.  (Ophthalmic Drugs)   Current Outpatient Prescriptions (Other)  Medication Sig  . amLODipine (NORVASC) 10 MG tablet Take 10 mg by mouth daily.  . calcium carbonate (OS-CAL - DOSED IN MG OF ELEMENTAL CALCIUM) 1250 (500 Ca) MG tablet Take 1 tablet by mouth.  . ezetimibe (ZETIA) 10 MG tablet Take 10 mg by mouth daily.  Marland Kitchen lisinopril-hydrochlorothiazide (PRINZIDE,ZESTORETIC) 20-25 MG tablet Take 1 tablet by  mouth daily.  . Multiple Vitamins-Minerals (ICAPS AREDS 2 PO) Take by mouth.  . Omega-3 Fatty Acids (FISH OIL) 1000 MG CAPS Take by mouth.  . phenytoin (DILANTIN) 100 MG ER capsule Take by mouth 3 (three) times daily.  . cephALEXin (KEFLEX) 500 MG capsule Take 1 capsule (500 mg total) by mouth 2 (two) times daily. (Patient not taking: Reported on 11/21/2016)  . HYDROcodone-acetaminophen (NORCO/VICODIN) 5-325 MG tablet Take 1-2 tablets by mouth every 4 (four) hours as needed for moderate pain or severe pain. (Patient not taking: Reported on 11/21/2016)  . naproxen (NAPROSYN) 375 MG tablet Take 1 tablet (375 mg total) by mouth 2 (two) times daily. (Patient not taking: Reported on 11/21/2016)   No current facility-administered medications for this visit.  (Other)      REVIEW OF SYSTEMS: ROS    Positive for: Cardiovascular, Eyes   Negative for: Constitutional, Gastrointestinal, Neurological, Skin, Genitourinary, Musculoskeletal, HENT, Endocrine, Respiratory, Psychiatric, Allergic/Imm, Heme/Lymph   Last edited by Roselee Nova D on 11/21/2016  2:29 PM. (History)       ALLERGIES No Known Allergies  PAST MEDICAL HISTORY Past Medical History:  Diagnosis Date  . H/O: hysterectomy 2003  . Heart disease   . Heart murmur   . Hyperlipemia   . Hypertension   . Hypertension   . Obesity   . Seizures ([place])    Past Surgical History:  Procedure Laterality Date  . ABDOMINAL HYSTERECTOMY      FAMILY HISTORY Family History  Problem Relation Age of Onset  . Asthma  Father   . Hypertension Father   . Diabetes Brother     SOCIAL HISTORY Social History  Substance Use Topics  . Smoking status: Never Smoker  . Smokeless tobacco: Never Used  . Alcohol use No         OPHTHALMIC EXAM:  Base Eye Exam    Visual Acuity (Snellen - Linear)      Right Left   Dist cc 20/25 20/25-1   Dist ph cc NI NI   Correction:  Glasses       Tonometry (Tonopen, 2:34 PM)      Right Left   Pressure  18 15       Pupils      Dark Light Shape React APD   Right 3 2 Round 1 None   Left 3 2 Round 1 None       Visual Fields      Left Right    Full Full       Extraocular Movement      Right Left    Full, Ortho Full, Ortho       Neuro/Psych    Oriented x3:  Yes   Mood/Affect:  Normal       Dilation    Both eyes:  1.0% Mydriacyl, 2.5% Phenylephrine @ 2:34 PM        Slit Lamp and Fundus Exam    Slit Lamp Exam      Right Left   Lids/Lashes Dermatochalasis - upper lid; pigmented lesions Dermatochalasis - upper lid; pigmented lesions   Conjunctiva/Sclera melanosis - most prom temporally Melanosis; , Pinguecula - nasal   Cornea Arcus Arcus   Anterior Chamber Deep and quiet Deep and quiet   Iris Round and dilated Round and dilated   Lens 2-3+ Nuclear sclerosis, 2-3+ Cortical cataract, Posterior subcapsular cataract - trace 2-3+ Nuclear sclerosis, 2-3+ Cortical cataract, Posterior subcapsular cataract - trace   Vitreous Vitreous syneresis, Posterior vitreous detachment Vitreous syneresis, Posterior vitreous detachment       Fundus Exam      Right Left   Disc Normal Normal   C/D Ratio 0.2 0.3   Macula Flat, Retinal pigment epithelial mottling and clumping; no heme or edema Flat, Retinal pigment epithelial mottling and clumping; no heme or edema, Drusen   Vessels Normal Normal   Periphery Attached; scattered peripheral drusen Attached; scattered peripheral drusen most prom nasally        Refraction    Wearing Rx      Sphere Cylinder Axis Add   Right +1.75 +0.25 145 +2.50   Left +0.75 +0.50 052 +2.50       Manifest Refraction      Dist VA   Right 20/20   Left 20/25          IMAGING AND PROCEDURES  Imaging and Procedures for 11/21/16  OCT, Retina - OU - Both Eyes     Right Eye Quality was good. Central Foveal Thickness: 209. Progression has no prior data. Findings include no SRF, no IRF, abnormal foveal contour (Broad foveal pit).   Left Eye Quality was  good. Central Foveal Thickness: 215. Progression has no prior data. Findings include no SRF, no IRF, abnormal foveal contour (Broad foveal pit; low lying PED nasal fovea with surrounding outer retinal disruptions).   Notes Images taken, stored on drive   Diagnosis / Impression:  non exudative ARMD OU; OS with low lying PED but no significant IRF/SRF  Clinical management:  See below  Abbreviations: NFP -  Normal foveal profile. CME - cystoid macular edema. PED - pigment epithelial detachment. IRF - intraretinal fluid. SRF - subretinal fluid. EZ - ellipsoid zone. ERM - epiretinal membrane. ORA - outer retinal atrophy. ORT - outer retinal tubulation. SRHM - subretinal hyper-reflective material                ASSESSMENT/PLAN:    ICD-10-CM   1. 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 Posterior vitreous detachment of both eyes H43.813   3. 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 Non-exudative ARMD OU  Recommend continuation of AREDS 2 vitamins  Avoid tobacco products  [place] grid for weekly vision checks. Patient instructed to test one eye at a time.    Patient to call us if appearance of grid is changing (lines curved or missing) or other changes in vision are noted.   Patient educated that interventions for exudative (wet) macular degeneration work best if used urgently after changes are noted  F/u 3-4 mos  2. PVD OU  Discussed findings and prognosis  asymptomatic  No RT or RD on 360 exam  Reviewed s/s of RT/RD  Strict return precautions for any such RT/RD signs/symptoms  3. [place], CC OU  The symptoms of cataract, surgical options, and treatments and risks were discussed with patient.  discussed diagnosis and progression  not yet visually significant  monitor for now   Ophthalmic Meds Ordered this visit:  No orders of the defined types were placed in this encounter.       Return in about 3 months (around 02/21/2017) for Dilated exam.  There are no Patient Instructions on file for this visit.   Explained the diagnoses, plan, and follow up with the patient and they expressed understanding.  Patient expressed understanding of the importance of proper follow up care.   Gardiner Sleeper, M.D., Ph.D. Vitreoretinal Surgeon [facility] 11/21/16     Abbreviations: M myopia (nearsighted); A astigmatism; H hyperopia (farsighted); P presbyopia; Mrx spectacle prescription;  CTL contact lenses; OD right eye; OS left eye; OU both eyes  XT exotropia; ET esotropia; PEK punctate epithelial keratitis; PEE punctate epithelial erosions; DES dry eye syndrome; MGD meibomian gland dysfunction; ATs artificial tears; PFAT's preservative free artificial tears; [place] nuclear sclerotic cataract; PSC posterior subcapsular cataract; ERM epi-retinal membrane; PVD posterior vitreous detachment; RD retinal detachment; DM diabetes mellitus; DR diabetic retinopathy; NPDR non-proliferative diabetic retinopathy; PDR proliferative diabetic retinopathy; CSME clinically significant macular edema; DME diabetic macular edema; dbh dot blot hemorrhages; CWS cotton wool spot; POAG primary open angle glaucoma; C/D cup-to-disc ratio; HVF humphrey visual field; GVF goldmann visual field; OCT optical coherence tomography; IOP intraocular pressure; BRVO Branch retinal vein occlusion; CRVO central retinal vein occlusion; CRAO central retinal artery occlusion; BRAO branch retinal artery occlusion; RT retinal tear; SB scleral buckle; PPV pars plana vitrectomy; VH Vitreous hemorrhage; PRP panretinal laser photocoagulation; IVK intravitreal kenalog; VMT vitreomacular traction; MH Macular hole;  NVD neovascularization of the disc; NVE neovascularization elsewhere; AREDS age related eye disease study; ARMD age related macular degeneration; POAG primary open angle glaucoma; EBMD epithelial/anterior  basement membrane dystrophy; ACIOL anterior chamber intraocular lens; IOL intraocular lens; PCIOL posterior chamber intraocular lens; Phaco/IOL phacoemulsification with intraocular lens placement; [place] photorefractive keratectomy; LASIK laser assisted in situ keratomileusis; HTN hypertension; DM diabetes mellitus; COPD chronic obstructive pulmonary disease

## 2017-02-20 NOTE — Progress Notes (Deleted)
[facility] - Clinic Note  02/21/2017     CHIEF COMPLAINT Patient presents for No chief complaint on file.   HISTORY OF PRESENT ILLNESS: Cassandra Jones is a 77 y.o. female who presents to the clinic today for:     Referring physician: Laurann Montana, MD [phone] [address] [place], [place] [address]  HISTORICAL INFORMATION:   Selected notes from the MEDICAL RECORD NUMBER Referral from Dr. 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   No current facility-administered medications for this visit.  (Ophthalmic Drugs)   Current Outpatient Medications (Other)  Medication Sig   amLODipine (NORVASC) 10 MG tablet Take 10 mg by mouth daily.   calcium carbonate (OS-CAL - DOSED IN MG OF ELEMENTAL CALCIUM) 1250 (500 Ca) MG tablet Take 1 tablet by mouth.   cephALEXin (KEFLEX) 500 MG capsule Take 1 capsule (500 mg total) by mouth 2 (two) times daily. (Patient not taking: Reported on 11/21/2016)   ezetimibe (ZETIA) 10 MG tablet Take 10 mg by mouth daily.   HYDROcodone-acetaminophen (NORCO/VICODIN) 5-325 MG tablet Take 1-2 tablets by mouth every 4 (four) hours as needed for moderate pain or severe pain. (Patient not taking: Reported on 11/21/2016)   lisinopril-hydrochlorothiazide (PRINZIDE,ZESTORETIC) 20-25 MG tablet Take 1 tablet by mouth daily.   Multiple Vitamins-Minerals (ICAPS AREDS 2 PO) Take by mouth.   naproxen (NAPROSYN) 375 MG tablet Take 1 tablet (375 mg total) by mouth 2 (two) times daily. (Patient not taking: Reported on 11/21/2016)   Omega-3 Fatty Acids (FISH OIL) 1000 MG CAPS Take by mouth.   phenytoin (DILANTIN) 100 MG ER capsule Take by mouth 3 (three) times daily.   No current facility-administered medications  for this visit.  (Other)      REVIEW OF SYSTEMS:    ALLERGIES No Known Allergies  PAST MEDICAL HISTORY Past Medical History:  Diagnosis Date   H/O: hysterectomy 2003   Heart disease    Heart murmur    Hyperlipemia    Hypertension    Hypertension    Obesity    Seizures (HCC)    Past Surgical History:  Procedure Laterality Date   ABDOMINAL HYSTERECTOMY      FAMILY HISTORY Family History  Problem Relation Age of Onset   Asthma Father    Hypertension Father    Diabetes Brother     SOCIAL HISTORY Social History   Tobacco Use   Smoking status: Never Smoker   Smokeless tobacco: Never Used  Substance Use Topics   Alcohol use: No   Drug use: No         OPHTHALMIC EXAM:   Not recorded      IMAGING AND PROCEDURES  Imaging and Procedures for 02/20/17           ASSESSMENT/PLAN:    ICD-10-CM   1. 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 Posterior vitreous detachment of both eyes H43.813   3. 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 Non-exudative ARMD OU  Recommend continuation of AREDS 2 vitamins  Avoid tobacco products  Amsler grid for weekly vision checks. Patient instructed to test one eye at a time.  Patient to call us if appearance of grid is changing (lines curved or missing) or other changes in vision are noted.   Patient educated that interventions for exudative (wet) macular degeneration work best if used urgently after changes are noted  F/u 3-4 mos  2. PVD OU  Discussed findings and prognosis  asymptomatic  No RT or RD on 360 exam  Reviewed s/s of RT/RD  Strict return precautions for any such RT/RD signs/symptoms  3. NSC, CC OU  The symptoms of cataract, surgical options, and treatments and risks were discussed with patient.  discussed diagnosis and progression  not yet visually significant  monitor for  now   Ophthalmic Meds Ordered this visit:  No orders of the defined types were placed in this encounter.      No Follow-up on file.  There are no Patient Instructions on file for this visit.   Explained the diagnoses, plan, and follow up with the patient and they expressed understanding.  Patient expressed understanding of the importance of proper follow up care.   This document serves as a record of services personally performed by Karie Chimera, MD, PhD. It was created on their behalf by Virgilio Belling, COA, a certified ophthalmic assistant. The creation of this record is the provider's dictation and/or activities during the visit.  Electronically signed by: Virgilio Belling, COA  02/20/17 3:21 PM    Karie Chimera, M.D., Ph.D. Vitreoretinal Surgeon [facility] 02/20/17     Abbreviations: M myopia (nearsighted); A astigmatism; H hyperopia (farsighted); P presbyopia; Mrx spectacle prescription;  CTL contact lenses; OD right eye; OS left eye; OU both eyes  XT exotropia; ET esotropia; PEK punctate epithelial keratitis; PEE punctate epithelial erosions; DES dry eye syndrome; MGD meibomian gland dysfunction; ATs artificial tears; PFAT's preservative free artificial tears; NSC nuclear sclerotic cataract; PSC posterior subcapsular cataract; ERM epi-retinal membrane; PVD posterior vitreous detachment; RD retinal detachment; DM diabetes mellitus; DR diabetic retinopathy; NPDR non-proliferative diabetic retinopathy; PDR proliferative diabetic retinopathy; CSME clinically significant macular edema; DME diabetic macular edema; dbh dot blot hemorrhages; CWS cotton wool spot; POAG primary open angle glaucoma; C/D cup-to-disc ratio; HVF humphrey visual field; GVF goldmann visual field; OCT optical coherence tomography; IOP intraocular pressure; BRVO Branch retinal vein occlusion; CRVO central retinal vein occlusion; CRAO central retinal artery occlusion; BRAO branch  retinal artery occlusion; RT retinal tear; SB scleral buckle; PPV pars plana vitrectomy; VH Vitreous hemorrhage; PRP panretinal laser photocoagulation; IVK intravitreal kenalog; VMT vitreomacular traction; MH Macular hole;  NVD neovascularization of the disc; NVE neovascularization elsewhere; AREDS age related eye disease study; ARMD age related macular degeneration; POAG primary open angle glaucoma; EBMD epithelial/anterior basement membrane dystrophy; ACIOL anterior chamber intraocular lens; IOL intraocular lens; PCIOL posterior chamber intraocular lens; Phaco/IOL phacoemulsification with intraocular lens placement; PRK photorefractive keratectomy; LASIK laser assisted in situ keratomileusis; HTN hypertension; DM diabetes mellitus; COPD chronic obstructive pulmonary disease

## 2017-02-21 ENCOUNTER — Encounter (INDEPENDENT_AMBULATORY_CARE_PROVIDER_SITE_OTHER): Payer: Commercial Managed Care - HMO | Admitting: Ophthalmology

## 2017-02-28 NOTE — Progress Notes (Signed)
[facility] - Clinic Note  03/01/2017     CHIEF COMPLAINT Patient presents for Retina Follow Up   HISTORY OF PRESENT ILLNESS: Cassandra Jones is a 77 y.o. female who presents to the clinic today for:   HPI    Retina Follow Up    Patient presents with  Dry AMD.  In both eyes.  Severity is mild.  Since onset it is stable.  I, the attending physician,  performed the HPI with the patient and updated documentation appropriately.          Comments    F/U Non Exu- AMD OU. Patient states her vision is the same no visual changes. Denies floater, flashes and ocular pain. Pt takes AREDS , multivitamin qd. She uses eye lubricating gtts Prn.       Last edited by Rennis Chris, MD on 03/01/2017 10:40 AM. (History)    Pt states OU VA is stable since last visit; Pt report she is taking AREDS 2 as directed;   Referring physician: Laurann Montana, MD [phone] [address] [place], [place] [address]  HISTORICAL INFORMATION:   Selected notes from the MEDICAL RECORD NUMBER Referral from Dr. Eugene Garnet for concern of wet ARMD OS;  Ocular Hx- DES OU; cataract OU; currently taking AREDS 2;  PMH- HTN; seizures;    CURRENT MEDICATIONS: Current Outpatient Medications (Ophthalmic Drugs)  Medication Sig  . Artificial Tear Ointment (EYE LUBRICANT OP) Apply to eye.   No current facility-administered medications for this visit.  (Ophthalmic Drugs)   Current Outpatient Medications (Other)  Medication Sig  . amLODipine (NORVASC) 10 MG tablet Take 10 mg by mouth daily.  . calcium carbonate (OS-CAL - DOSED IN MG OF ELEMENTAL CALCIUM) 1250 (500 Ca) MG tablet Take 1 tablet by mouth.  . cephALEXin (KEFLEX) 500 MG capsule Take 1 capsule (500 mg total) by mouth 2 (two) times daily.  Marland Kitchen ezetimibe (ZETIA) 10 MG tablet Take 10 mg by mouth daily.  Marland Kitchen lisinopril-hydrochlorothiazide (PRINZIDE,ZESTORETIC) 20-25 MG tablet Take 1 tablet by mouth daily.  . Multiple Vitamins-Minerals (ICAPS AREDS  2 PO) Take by mouth.  . naproxen (NAPROSYN) 375 MG tablet Take 1 tablet (375 mg total) by mouth 2 (two) times daily.  . Omega-3 Fatty Acids (FISH OIL) 1000 MG CAPS Take by mouth.  . phenytoin (DILANTIN) 100 MG ER capsule Take by mouth 3 (three) times daily.  Marland Kitchen HYDROcodone-acetaminophen (NORCO/VICODIN) 5-325 MG tablet Take 1-2 tablets by mouth every 4 (four) hours as needed for moderate pain or severe pain. (Patient not taking: Reported on 03/01/2017)   No current facility-administered medications for this visit.  (Other)      REVIEW OF SYSTEMS: ROS    Positive for: Cardiovascular, Eyes   Negative for: Constitutional, Gastrointestinal, Neurological, Skin, Genitourinary, Musculoskeletal, HENT, Endocrine, Respiratory, Psychiatric, Allergic/Imm, Heme/Lymph   Last edited by Eldridge Scot, LPN on 02/08/5850 10:12 AM. (History)       ALLERGIES No Known Allergies  PAST MEDICAL HISTORY Past Medical History:  Diagnosis Date  . H/O: hysterectomy 2003  . Heart disease   . Heart murmur   . Hyperlipemia   . Hypertension   . Hypertension   . Obesity   . Seizures (HCC)    Past Surgical History:  Procedure Laterality Date  . ABDOMINAL HYSTERECTOMY      FAMILY HISTORY Family History  Problem Relation Age of Onset  . Asthma Father   . Hypertension Father   . Diabetes Brother  SOCIAL HISTORY Social History   Tobacco Use  . Smoking status: Never Smoker  . Smokeless tobacco: Never Used  Substance Use Topics  . Alcohol use: No  . Drug use: No         OPHTHALMIC EXAM:  Base Eye Exam    Visual Acuity (Snellen - Linear)      Right Left   Dist cc 20/25 -2 20/25 -1   Dist ph cc NI NI   Correction:  Glasses       Tonometry (Tonopen, 10:27 AM)      Right Left   Pressure 18 20       Pupils      Pupils Dark Light Shape React APD   Right PERRL 3 2 Round 1 None   Left PERRL 3 2 Round 1 None       Visual Fields (Counting fingers)      Left Right    Full Full        Extraocular Movement      Right Left    Full, Ortho Full, Ortho       Neuro/Psych    Oriented x3:  Yes   Mood/Affect:  Normal       Dilation    Both eyes:  1.0% Mydriacyl, Paremyd @ 10:27 AM        Slit Lamp and Fundus Exam    Slit Lamp Exam      Right Left   Lids/Lashes Dermatochalasis - upper lid; pigmented lesions Dermatochalasis - upper lid; pigmented lesions   Conjunctiva/Sclera melanosis - most prom temporally, Temporal Pinguecula, mild nasal pinguecula Melanosis; Pinguecula - nasal   Cornea Arcus, otherwise clear Arcus   Anterior Chamber Deep and quiet Deep and quiet   Iris Round and moderatrly dilated Round and moderatly dilated   Lens 2-3+ Nuclear sclerosis, 2-3+ Cortical cataract, Posterior subcapsular cataract - trace 2-3+ Nuclear sclerosis, 2-3+ Cortical cataract, Posterior subcapsular cataract - trace   Vitreous Vitreous syneresis, Posterior vitreous detachment Vitreous syneresis, Posterior vitreous detachment       Fundus Exam      Right Left   Disc Normal Normal   C/D Ratio 0.2 0.3   Macula Flat, Retinal pigment epithelial mottling and clumping; no heme or edema Flat, Retinal pigment epithelial mottling and clumping; no heme or edema, Drusen   Vessels Normal Normal   Periphery Attached; scattered peripheral drusen greatest inferiorly Attached; scattered peripheral drusen most prom nasally          IMAGING AND PROCEDURES  Imaging and Procedures for 03/01/17  OCT, Retina - OU - Both Eyes     Right Eye Quality was good. Central Foveal Thickness: 215. Progression has been stable. Findings include no SRF, no IRF, abnormal foveal contour (Broad foveal pit).   Left Eye Quality was borderline. Central Foveal Thickness: 221. Progression has been stable. Findings include no SRF, no IRF, abnormal foveal contour (Broad foveal pit; low lying PED nasal fovea with surrounding outer retinal disruptions).   Notes Images taken, stored on drive   Diagnosis /  Impression:  non exudative ARMD OU; OS with low lying PED but no significant IRF/SRF  Clinical management:  See below  Abbreviations: NFP - Normal foveal profile. CME - cystoid macular edema. PED - pigment epithelial detachment. IRF - intraretinal fluid. SRF - subretinal fluid. EZ - ellipsoid zone. ERM - epiretinal membrane. ORA - outer retinal atrophy. ORT - outer retinal tubulation. SRHM - subretinal hyper-reflective material  ASSESSMENT/PLAN:    ICD-10-CM   1. 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 Posterior vitreous detachment of both eyes H43.813   3. 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 Non-exudative ARMD OU - stable  Recommend continuation of AREDS 2 vitamins  Avoid tobacco products  Amsler grid for weekly vision checks. Patient instructed to test one eye at a time.    Patient to call us if appearance of grid is changing (lines curved or missing) or other changes in vision are noted.   Patient educated that interventions for exudative (wet) macular degeneration work best if used urgently after changes are noted  F/u 3-4 mos  2. PVD OU  Discussed findings and prognosis  asymptomatic  No RT or RD on 360 exam  Reviewed s/s of RT/RD  Strict return precautions for any such RT/RD signs/symptoms  3. NSC, CC OU  The symptoms of cataract, surgical options, and treatments and risks were discussed with patient.  discussed diagnosis and progression  not yet visually significant  monitor for now   Ophthalmic Meds Ordered this visit:  No orders of the defined types were placed in this encounter.      Return in about 3 months (around 05/30/2017) for F/U Non-Exu AMD OU.  There are no Patient Instructions on file for this visit.   Explained the diagnoses, plan, and follow up with the patient and they expressed understanding.  Patient expressed  understanding of the importance of proper follow up care.   This document serves as a record of services personally performed by Karie Chimera, MD, PhD. It was created on their behalf by Virgilio Belling, COA, a certified ophthalmic assistant. The creation of this record is the provider's dictation and/or activities during the visit.  Electronically signed by: Virgilio Belling, COA  03/01/17 11:02 AM   Karie Chimera, M.D., Ph.D. Vitreoretinal Surgeon [facility] 03/01/17   I have reviewed the above documentation for accuracy and completeness, and I agree with the above. Karie Chimera, M.D., Ph.D. 03/01/17 11:03 AM    Abbreviations: M myopia (nearsighted); A astigmatism; H hyperopia (farsighted); P presbyopia; Mrx spectacle prescription;  CTL contact lenses; OD right eye; OS left eye; OU both eyes  XT exotropia; ET esotropia; PEK punctate epithelial keratitis; PEE punctate epithelial erosions; DES dry eye syndrome; MGD meibomian gland dysfunction; ATs artificial tears; PFAT's preservative free artificial tears; NSC nuclear sclerotic cataract; PSC posterior subcapsular cataract; ERM epi-retinal membrane; PVD posterior vitreous detachment; RD retinal detachment; DM diabetes mellitus; DR diabetic retinopathy; NPDR non-proliferative diabetic retinopathy; PDR proliferative diabetic retinopathy; CSME clinically significant macular edema; DME diabetic macular edema; dbh dot blot hemorrhages; CWS cotton wool spot; POAG primary open angle glaucoma; C/D cup-to-disc ratio; HVF humphrey visual field; GVF goldmann visual field; OCT optical coherence tomography; IOP intraocular pressure; BRVO Branch retinal vein occlusion; CRVO central retinal vein occlusion; CRAO central retinal artery occlusion; BRAO branch retinal artery occlusion; RT retinal tear; SB scleral buckle; PPV pars plana vitrectomy; VH Vitreous hemorrhage; PRP panretinal laser photocoagulation; IVK intravitreal kenalog;  VMT vitreomacular traction; MH Macular hole;  NVD neovascularization of the disc; NVE neovascularization elsewhere; AREDS age related eye disease study; ARMD age related macular degeneration; POAG primary open angle glaucoma; EBMD epithelial/anterior basement membrane dystrophy; ACIOL anterior chamber intraocular lens; IOL intraocular lens; PCIOL posterior chamber intraocular lens; Phaco/IOL phacoemulsification with intraocular lens placement; PRK photorefractive  keratectomy; LASIK laser assisted in situ keratomileusis; HTN hypertension; DM diabetes mellitus; COPD chronic obstructive pulmonary disease

## 2017-03-01 ENCOUNTER — Ambulatory Visit (INDEPENDENT_AMBULATORY_CARE_PROVIDER_SITE_OTHER): Payer: Commercial Managed Care - HMO | Admitting: Ophthalmology

## 2017-03-01 ENCOUNTER — Encounter (INDEPENDENT_AMBULATORY_CARE_PROVIDER_SITE_OTHER): Payer: Self-pay | Admitting: Ophthalmology

## 2017-03-01 DIAGNOSIS — H353132 Nonexudative age-related macular degeneration, bilateral, intermediate dry stage: Secondary | ICD-10-CM | POA: Diagnosis not present

## 2017-03-01 DIAGNOSIS — H43813 Vitreous degeneration, bilateral: Secondary | ICD-10-CM | POA: Diagnosis not present

## 2017-03-01 DIAGNOSIS — H25813 Combined forms of age-related cataract, bilateral: Secondary | ICD-10-CM

## 2017-04-26 DIAGNOSIS — Z0000 Encounter for general adult medical examination without abnormal findings: Secondary | ICD-10-CM | POA: Diagnosis not present

## 2017-04-26 DIAGNOSIS — Z1389 Encounter for screening for other disorder: Secondary | ICD-10-CM | POA: Diagnosis not present

## 2017-04-26 DIAGNOSIS — Z1211 Encounter for screening for malignant neoplasm of colon: Secondary | ICD-10-CM | POA: Diagnosis not present

## 2017-04-26 DIAGNOSIS — Z79899 Other long term (current) drug therapy: Secondary | ICD-10-CM | POA: Diagnosis not present

## 2017-04-26 DIAGNOSIS — R946 Abnormal results of thyroid function studies: Secondary | ICD-10-CM | POA: Diagnosis not present

## 2017-04-26 DIAGNOSIS — E785 Hyperlipidemia, unspecified: Secondary | ICD-10-CM | POA: Diagnosis not present

## 2017-04-26 DIAGNOSIS — G40909 Epilepsy, unspecified, not intractable, without status epilepticus: Secondary | ICD-10-CM | POA: Diagnosis not present

## 2017-04-26 DIAGNOSIS — I10 Essential (primary) hypertension: Secondary | ICD-10-CM | POA: Diagnosis not present

## 2017-04-26 DIAGNOSIS — Z23 Encounter for immunization: Secondary | ICD-10-CM | POA: Diagnosis not present

## 2017-05-05 ENCOUNTER — Other Ambulatory Visit: Payer: Self-pay | Admitting: Family Medicine

## 2017-05-05 DIAGNOSIS — Z1231 Encounter for screening mammogram for malignant neoplasm of breast: Secondary | ICD-10-CM

## 2017-05-25 DIAGNOSIS — I10 Essential (primary) hypertension: Secondary | ICD-10-CM | POA: Diagnosis not present

## 2017-05-25 DIAGNOSIS — E0590 Thyrotoxicosis, unspecified without thyrotoxic crisis or storm: Secondary | ICD-10-CM | POA: Diagnosis not present

## 2017-05-25 DIAGNOSIS — E0789 Other specified disorders of thyroid: Secondary | ICD-10-CM | POA: Diagnosis not present

## 2017-05-29 ENCOUNTER — Other Ambulatory Visit (HOSPITAL_COMMUNITY): Payer: Self-pay | Admitting: Endocrinology

## 2017-05-29 DIAGNOSIS — E0590 Thyrotoxicosis, unspecified without thyrotoxic crisis or storm: Secondary | ICD-10-CM

## 2017-06-01 ENCOUNTER — Ambulatory Visit
Admission: RE | Admit: 2017-06-01 | Discharge: 2017-06-01 | Disposition: A | Discharge status: Home / Self Care | Payer: Medicare HMO | Source: Ambulatory Visit | Attending: Family Medicine | Admitting: Family Medicine

## 2017-06-01 DIAGNOSIS — Z1231 Encounter for screening mammogram for malignant neoplasm of breast: Secondary | ICD-10-CM

## 2017-06-08 ENCOUNTER — Encounter (HOSPITAL_COMMUNITY)
Admission: RE | Admit: 2017-06-08 | Discharge: 2017-06-08 | Disposition: A | Discharge status: Home / Self Care | Payer: Medicare HMO | Source: Ambulatory Visit | Attending: Endocrinology | Admitting: Endocrinology

## 2017-06-08 DIAGNOSIS — E0590 Thyrotoxicosis, unspecified without thyrotoxic crisis or storm: Secondary | ICD-10-CM

## 2017-06-08 MED ORDER — SODIUM IODIDE I-123 7.4 MBQ CAPS
292.0000 | ORAL_CAPSULE | Freq: Once | ORAL | Status: AC
  Administered 2017-06-08: 292 via ORAL

## 2017-06-09 ENCOUNTER — Encounter (HOSPITAL_COMMUNITY)
Admission: RE | Admit: 2017-06-09 | Discharge: 2017-06-09 | Disposition: A | Discharge status: Home / Self Care | Payer: Medicare HMO | Source: Ambulatory Visit | Attending: Endocrinology | Admitting: Endocrinology

## 2017-06-09 DIAGNOSIS — E0590 Thyrotoxicosis, unspecified without thyrotoxic crisis or storm: Secondary | ICD-10-CM | POA: Diagnosis not present

## 2017-06-14 ENCOUNTER — Encounter (INDEPENDENT_AMBULATORY_CARE_PROVIDER_SITE_OTHER): Payer: Commercial Managed Care - HMO | Admitting: Ophthalmology

## 2017-10-04 DIAGNOSIS — I10 Essential (primary) hypertension: Secondary | ICD-10-CM | POA: Diagnosis not present

## 2017-10-04 DIAGNOSIS — E0590 Thyrotoxicosis, unspecified without thyrotoxic crisis or storm: Secondary | ICD-10-CM | POA: Diagnosis not present

## 2017-10-11 DIAGNOSIS — E0590 Thyrotoxicosis, unspecified without thyrotoxic crisis or storm: Secondary | ICD-10-CM | POA: Diagnosis not present

## 2017-10-11 DIAGNOSIS — I10 Essential (primary) hypertension: Secondary | ICD-10-CM | POA: Diagnosis not present

## 2017-10-11 DIAGNOSIS — Z23 Encounter for immunization: Secondary | ICD-10-CM | POA: Diagnosis not present

## 2017-10-27 DIAGNOSIS — I10 Essential (primary) hypertension: Secondary | ICD-10-CM | POA: Diagnosis not present

## 2017-10-27 DIAGNOSIS — E785 Hyperlipidemia, unspecified: Secondary | ICD-10-CM | POA: Diagnosis not present

## 2017-10-27 DIAGNOSIS — G40909 Epilepsy, unspecified, not intractable, without status epilepticus: Secondary | ICD-10-CM | POA: Diagnosis not present

## 2017-11-28 DIAGNOSIS — H353131 Nonexudative age-related macular degeneration, bilateral, early dry stage: Secondary | ICD-10-CM | POA: Diagnosis not present

## 2017-11-28 DIAGNOSIS — H04123 Dry eye syndrome of bilateral lacrimal glands: Secondary | ICD-10-CM | POA: Diagnosis not present

## 2017-11-28 DIAGNOSIS — H10413 Chronic giant papillary conjunctivitis, bilateral: Secondary | ICD-10-CM | POA: Diagnosis not present

## 2017-11-28 DIAGNOSIS — H25813 Combined forms of age-related cataract, bilateral: Secondary | ICD-10-CM | POA: Diagnosis not present

## 2017-11-28 DIAGNOSIS — H40013 Open angle with borderline findings, low risk, bilateral: Secondary | ICD-10-CM | POA: Diagnosis not present

## 2018-04-30 DIAGNOSIS — E0590 Thyrotoxicosis, unspecified without thyrotoxic crisis or storm: Secondary | ICD-10-CM | POA: Diagnosis not present

## 2018-05-07 DIAGNOSIS — E0590 Thyrotoxicosis, unspecified without thyrotoxic crisis or storm: Secondary | ICD-10-CM | POA: Diagnosis not present

## 2018-05-14 DIAGNOSIS — E0590 Thyrotoxicosis, unspecified without thyrotoxic crisis or storm: Secondary | ICD-10-CM | POA: Diagnosis not present

## 2018-05-14 DIAGNOSIS — I10 Essential (primary) hypertension: Secondary | ICD-10-CM | POA: Diagnosis not present

## 2018-06-07 DIAGNOSIS — I10 Essential (primary) hypertension: Secondary | ICD-10-CM | POA: Diagnosis not present

## 2018-06-07 DIAGNOSIS — G40909 Epilepsy, unspecified, not intractable, without status epilepticus: Secondary | ICD-10-CM | POA: Diagnosis not present

## 2018-06-07 DIAGNOSIS — Z0000 Encounter for general adult medical examination without abnormal findings: Secondary | ICD-10-CM | POA: Diagnosis not present

## 2018-06-07 DIAGNOSIS — E0590 Thyrotoxicosis, unspecified without thyrotoxic crisis or storm: Secondary | ICD-10-CM | POA: Diagnosis not present

## 2018-06-07 DIAGNOSIS — E785 Hyperlipidemia, unspecified: Secondary | ICD-10-CM | POA: Diagnosis not present

## 2018-06-14 DIAGNOSIS — G40909 Epilepsy, unspecified, not intractable, without status epilepticus: Secondary | ICD-10-CM | POA: Diagnosis not present

## 2018-06-14 DIAGNOSIS — I10 Essential (primary) hypertension: Secondary | ICD-10-CM | POA: Diagnosis not present

## 2018-06-14 DIAGNOSIS — E785 Hyperlipidemia, unspecified: Secondary | ICD-10-CM | POA: Diagnosis not present

## 2018-06-14 DIAGNOSIS — D649 Anemia, unspecified: Secondary | ICD-10-CM | POA: Diagnosis not present

## 2018-06-20 DIAGNOSIS — Z1211 Encounter for screening for malignant neoplasm of colon: Secondary | ICD-10-CM | POA: Diagnosis not present

## 2018-11-08 DIAGNOSIS — Z23 Encounter for immunization: Secondary | ICD-10-CM | POA: Diagnosis not present

## 2018-11-13 DIAGNOSIS — E0590 Thyrotoxicosis, unspecified without thyrotoxic crisis or storm: Secondary | ICD-10-CM | POA: Diagnosis not present

## 2019-01-24 DIAGNOSIS — Z03818 Encounter for observation for suspected exposure to other biological agents ruled out: Secondary | ICD-10-CM | POA: Diagnosis not present

## 2019-03-07 ENCOUNTER — Ambulatory Visit: Payer: Medicare HMO

## 2019-03-16 ENCOUNTER — Ambulatory Visit: Payer: Medicare HMO | Attending: Internal Medicine

## 2019-03-16 DIAGNOSIS — Z23 Encounter for immunization: Secondary | ICD-10-CM | POA: Insufficient documentation

## 2019-03-16 NOTE — Progress Notes (Signed)
   Covid-19 Vaccination Clinic  Name:  Cassandra Jones    MRN: [id number] DOB: 10-01-1940  03/16/2019  Ms. Torosyan was observed post Covid-19 immunization for 15 minutes without incidence. She was provided with Vaccine Information Sheet and instruction to access the V-Safe system.   Ms. Kohlbeck was instructed to call 911 with any severe reactions post vaccine: Marland Kitchen Difficulty breathing  . Swelling of your face and throat  . A fast heartbeat  . A bad rash all over your body  . Dizziness and weakness    Immunizations Administered    Name Date Dose VIS Date Route   Pfizer COVID-19 Vaccine 03/16/2019 10:29 AM 0.3 mL 01/18/2019 Intramuscular   Manufacturer: [employer], [employer]   Lot: [id number]   NDC: [address]-1

## 2019-03-18 ENCOUNTER — Ambulatory Visit: Payer: Medicare HMO

## 2019-03-21 DIAGNOSIS — E785 Hyperlipidemia, unspecified: Secondary | ICD-10-CM | POA: Diagnosis not present

## 2019-03-21 DIAGNOSIS — I10 Essential (primary) hypertension: Secondary | ICD-10-CM | POA: Diagnosis not present

## 2019-04-08 ENCOUNTER — Ambulatory Visit: Payer: Medicare HMO | Attending: Internal Medicine

## 2019-04-08 DIAGNOSIS — Z23 Encounter for immunization: Secondary | ICD-10-CM | POA: Insufficient documentation

## 2019-04-08 NOTE — Progress Notes (Signed)
   Covid-19 Vaccination Clinic  Name:  Cassandra Jones    MRN: [id number] DOB: 1940/09/24  04/08/2019  Cassandra Jones was observed post Covid-19 immunization for 15 minutes without incidence. She was provided with Vaccine Information Sheet and instruction to access the V-Safe system.   Cassandra Jones was instructed to call 911 with any severe reactions post vaccine: Marland Kitchen Difficulty breathing  . Swelling of your face and throat  . A fast heartbeat  . A bad rash all over your body  . Dizziness and weakness    Immunizations Administered    Name Date Dose VIS Date Route   Pfizer COVID-19 Vaccine 04/08/2019  2:48 PM 0.3 mL 01/18/2019 Intramuscular   Manufacturer: [employer], [employer]   Lot: [id number]   NDC: [address]-9

## 2019-04-10 ENCOUNTER — Ambulatory Visit: Payer: Medicare HMO

## 2019-05-07 DIAGNOSIS — I10 Essential (primary) hypertension: Secondary | ICD-10-CM | POA: Diagnosis not present

## 2019-05-07 DIAGNOSIS — E0590 Thyrotoxicosis, unspecified without thyrotoxic crisis or storm: Secondary | ICD-10-CM | POA: Diagnosis not present

## 2019-05-14 DIAGNOSIS — E0590 Thyrotoxicosis, unspecified without thyrotoxic crisis or storm: Secondary | ICD-10-CM | POA: Diagnosis not present

## 2019-05-14 DIAGNOSIS — I10 Essential (primary) hypertension: Secondary | ICD-10-CM | POA: Diagnosis not present

## 2019-07-22 DIAGNOSIS — H25813 Combined forms of age-related cataract, bilateral: Secondary | ICD-10-CM | POA: Diagnosis not present

## 2019-07-22 DIAGNOSIS — H40013 Open angle with borderline findings, low risk, bilateral: Secondary | ICD-10-CM | POA: Diagnosis not present

## 2019-07-22 DIAGNOSIS — H10413 Chronic giant papillary conjunctivitis, bilateral: Secondary | ICD-10-CM | POA: Diagnosis not present

## 2019-07-22 DIAGNOSIS — H353131 Nonexudative age-related macular degeneration, bilateral, early dry stage: Secondary | ICD-10-CM | POA: Diagnosis not present

## 2019-07-22 DIAGNOSIS — H04123 Dry eye syndrome of bilateral lacrimal glands: Secondary | ICD-10-CM | POA: Diagnosis not present

## 2019-08-07 DIAGNOSIS — E785 Hyperlipidemia, unspecified: Secondary | ICD-10-CM | POA: Diagnosis not present

## 2019-08-07 DIAGNOSIS — I10 Essential (primary) hypertension: Secondary | ICD-10-CM | POA: Diagnosis not present

## 2019-09-01 ENCOUNTER — Ambulatory Visit (HOSPITAL_COMMUNITY)
Admission: EM | Admit: 2019-09-01 | Discharge: 2019-09-01 | Disposition: A | Discharge status: Home / Self Care | Payer: Medicare HMO

## 2019-09-01 ENCOUNTER — Encounter (HOSPITAL_COMMUNITY): Payer: Self-pay | Admitting: Emergency Medicine

## 2019-09-01 ENCOUNTER — Other Ambulatory Visit: Payer: Self-pay

## 2019-09-01 DIAGNOSIS — M25561 Pain in right knee: Secondary | ICD-10-CM

## 2019-09-01 MED ORDER — NAPROXEN 250 MG PO TABS: 250.0000 mg | ORAL_TABLET | Freq: Two times a day (BID) | ORAL | 0 refills | Status: DC

## 2019-09-01 MED ORDER — NAPROXEN 250 MG PO TABS: 250.0000 mg | ORAL_TABLET | Freq: Two times a day (BID) | ORAL | 0 refills | Status: AC

## 2019-09-01 NOTE — Discharge Instructions (Signed)
Ice and elevate the knee after activity.  Use of the knee sleeve as provided, especially when active.  Naproxen twice a day as needed for pain. Take with food.  Please follow up with orthopedics as you may need additional evaluation and treatment.

## 2019-09-01 NOTE — ED Triage Notes (Signed)
Right knee pain.  No known injury.  Right knee pain for a month intermittently.  Gradually, pain has worsened.

## 2019-09-02 NOTE — ED Provider Notes (Signed)
[facility]    CSN: [id number] Arrival date & time: 09/01/19  1517      History   Chief Complaint Chief Complaint  Patient presents with  . Knee Pain    HPI Cassandra Jones is a 79 y.o. female.   Cassandra Jones presents with complaints of right knee pain which has been coming and going for at least the past month. Activity worsens the pain. Tylenol does seem to help with the pain. No specific known injury. She has been elevating it at night which helps. Today after church it is more painful. No numbness tingling or weakness to the leg. No redness or warmth. No previous surgery to the knee. She is ambulatory.    ROS per HPI, negative if not otherwise mentioned.      Past Medical History:  Diagnosis Date  . H/O: hysterectomy 2003  . Heart disease   . Heart murmur   . Hyperlipemia   . Hypertension   . Hypertension   . Obesity   . Seizures (HCC)     There are no problems to display for this patient.   Past Surgical History:  Procedure Laterality Date  . ABDOMINAL HYSTERECTOMY      OB History   No obstetric history on file.      Home Medications    Prior to Admission medications   Medication Sig Start Date End Date Taking? Authorizing Provider  amLODipine (NORVASC) 10 MG tablet Take 10 mg by mouth daily.   Yes [provider]  ezetimibe (ZETIA) 10 MG tablet Take 10 mg by mouth daily.   Yes [provider]  lisinopril-hydrochlorothiazide (PRINZIDE,ZESTORETIC) 20-25 MG tablet Take 1 tablet by mouth daily.   Yes [provider]  methimazole (TAPAZOLE) 5 MG tablet  06/21/19  Yes [provider]  Multiple Vitamins-Minerals (ICAPS AREDS 2 PO) Take by mouth.   Yes [provider]  Omega-3 Fatty Acids (FISH OIL) 1000 MG CAPS Take by mouth.   Yes [provider]  phenytoin (DILANTIN) 100 MG ER capsule Take by mouth 3 (three) times daily.   Yes [provider]  Artificial Tear Ointment (EYE  LUBRICANT OP) Apply to eye.    [provider]  calcium carbonate (OS-CAL - DOSED IN MG OF ELEMENTAL CALCIUM) 1250 (500 Ca) MG tablet Take 1 tablet by mouth.    [provider]  cephALEXin (KEFLEX) 500 MG capsule Take 1 capsule (500 mg total) by mouth 2 (two) times daily. 07/03/14   Emilia Beck, PA-C  HYDROcodone-acetaminophen (NORCO/VICODIN) 5-325 MG tablet Take 1-2 tablets by mouth every 4 (four) hours as needed for moderate pain or severe pain. Patient not taking: Reported on 03/01/2017 03/07/15   Arby Barrette, MD  naproxen (NAPROSYN) 250 MG tablet Take 1 tablet (250 mg total) by mouth 2 (two) times daily with a meal. 09/01/19   Georgetta Haber, NP    Family History Family History  Problem Relation Age of Onset  . Asthma Father   . Hypertension Father   . Diabetes Brother   . Breast cancer Daughter     Social History Social History   Tobacco Use  . Smoking status: Never Smoker  . Smokeless tobacco: Never Used  Vaping Use  . Vaping Use: Never used  Substance Use Topics  . Alcohol use: No  . Drug use: No     Allergies   Patient has no known allergies.   Review of Systems Review of Systems  Physical Exam Triage Vital Signs ED Triage Vitals  Enc Vitals Group     BP 09/01/19 1620 (!) 150/77     Pulse Rate 09/01/19 1620 73     Resp 09/01/19 1620 18     Temp 09/01/19 1620 98.6 F (37 C)     Temp Source 09/01/19 1620 Oral     SpO2 09/01/19 1620 96 %     Weight --      Height --      Head Circumference --      Peak Flow --      Pain Score 09/01/19 1617 7     Pain Loc --      Pain Edu? --      Excl. in GC? --    No data found.  Updated Vital Signs BP (!) 150/77 (BP Location: Right Arm)   Pulse 73   Temp 98.6 F (37 C) (Oral)   Resp 18   SpO2 96%   Visual Acuity Right Eye Distance:   Left Eye Distance:   Bilateral Distance:    Right Eye Near:   Left Eye Near:    Bilateral Near:     Physical Exam Constitutional:       General: She is not in acute distress.    Appearance: She is well-developed.  Cardiovascular:     Rate and Rhythm: Normal rate.  Pulmonary:     Effort: Pulmonary effort is normal.  Musculoskeletal:     Right knee: Swelling present. No ecchymosis. Normal range of motion. Tenderness present over the medial joint line and MCL.     Comments: Mild swelling to medial right knee which is generally tender; no posterior knee tenderness; no redness or warmth; full ROM noted although some pain with flexion; no pain or laxity with medial or lateral stress application; ambulatory   Skin:    General: Skin is warm and dry.  Neurological:     Mental Status: She is alert and oriented to person, place, and time.      UC Treatments / Results  Labs (all labs ordered are listed, but only abnormal results are displayed) Labs Reviewed - No data to display  EKG   Radiology No results found.  Procedures Procedures (including critical care time)  Medications Ordered in UC Medications - No data to display  Initial Impression / Assessment and Plan / UC Course  I have reviewed the triage vital signs and the nursing notes.  Pertinent labs & imaging results that were available during my care of the patient were reviewed by me and considered in my medical decision making (see chart for details).     Meniscus vs mcl injury considered and discussed. Knee sleeve placed for support and compression. Pain management discussed. Encouraged follow up with orthopedics for definitive treatment. Patient verbalized understanding and agreeable to plan.   Final Clinical Impressions(s) / UC Diagnoses   Final diagnoses:  Right knee pain, unspecified chronicity     Discharge Instructions     Ice and elevate the knee after activity.  Use of the knee sleeve as provided, especially when active.  Naproxen twice a day as needed for pain. Take with food.  Please follow up with orthopedics as you may need additional  evaluation and treatment.    ED Prescriptions    Medication Sig Dispense Auth. Provider   naproxen (NAPROSYN) 250 MG tablet  (Status: Discontinued) Take 1 tablet (250 mg total) by mouth 2 (two) times daily with a meal. 30  tablet Linus Mako B, NP   naproxen (NAPROSYN) 250 MG tablet Take 1 tablet (250 mg total) by mouth 2 (two) times daily with a meal. 30 tablet Georgetta Haber, NP     PDMP not reviewed this encounter.   Georgetta Haber, NP 09/02/19 [phone]

## 2019-09-03 DIAGNOSIS — M1711 Unilateral primary osteoarthritis, right knee: Secondary | ICD-10-CM | POA: Diagnosis not present

## 2019-09-03 DIAGNOSIS — M25561 Pain in right knee: Secondary | ICD-10-CM | POA: Diagnosis not present

## 2019-10-08 DIAGNOSIS — M1711 Unilateral primary osteoarthritis, right knee: Secondary | ICD-10-CM | POA: Diagnosis not present

## 2019-11-13 DIAGNOSIS — E0590 Thyrotoxicosis, unspecified without thyrotoxic crisis or storm: Secondary | ICD-10-CM | POA: Diagnosis not present

## 2019-11-13 DIAGNOSIS — Z23 Encounter for immunization: Secondary | ICD-10-CM | POA: Diagnosis not present

## 2019-11-13 DIAGNOSIS — I10 Essential (primary) hypertension: Secondary | ICD-10-CM | POA: Diagnosis not present

## 2019-12-10 DIAGNOSIS — E785 Hyperlipidemia, unspecified: Secondary | ICD-10-CM | POA: Diagnosis not present

## 2019-12-10 DIAGNOSIS — I10 Essential (primary) hypertension: Secondary | ICD-10-CM | POA: Diagnosis not present

## 2019-12-23 DIAGNOSIS — G40909 Epilepsy, unspecified, not intractable, without status epilepticus: Secondary | ICD-10-CM | POA: Diagnosis not present

## 2019-12-23 DIAGNOSIS — E785 Hyperlipidemia, unspecified: Secondary | ICD-10-CM | POA: Diagnosis not present

## 2019-12-23 DIAGNOSIS — I10 Essential (primary) hypertension: Secondary | ICD-10-CM | POA: Diagnosis not present

## 2020-03-17 ENCOUNTER — Telehealth: Payer: Self-pay | Admitting: Adult Health

## 2020-03-17 NOTE — Telephone Encounter (Signed)
Called to discuss with patient about COVID-19 symptoms and the use of one of the available treatments for those with mild to moderate Covid symptoms and at a high risk of hospitalization.  Pt appears to qualify for outpatient treatment due to co-morbid conditions and/or a member of an at-risk group in accordance with the FDA Emergency Use Authorization.    Symptom onset: 2/4  Vaccinated: Yes  Booster? Yes  Immunocompromised? No  Qualifiers: Yes   Unable to reach pt - Reached patient, day 5 sx, fully vaccinated, feeling better. Continue supportive care and Covid precautions.   Bryler Dibble NP-C

## 2020-05-06 DIAGNOSIS — E0590 Thyrotoxicosis, unspecified without thyrotoxic crisis or storm: Secondary | ICD-10-CM | POA: Diagnosis not present

## 2020-05-06 DIAGNOSIS — I10 Essential (primary) hypertension: Secondary | ICD-10-CM | POA: Diagnosis not present

## 2020-05-13 DIAGNOSIS — I10 Essential (primary) hypertension: Secondary | ICD-10-CM | POA: Diagnosis not present

## 2020-05-13 DIAGNOSIS — E0590 Thyrotoxicosis, unspecified without thyrotoxic crisis or storm: Secondary | ICD-10-CM | POA: Diagnosis not present

## 2020-06-01 DIAGNOSIS — E785 Hyperlipidemia, unspecified: Secondary | ICD-10-CM | POA: Diagnosis not present

## 2020-06-01 DIAGNOSIS — I10 Essential (primary) hypertension: Secondary | ICD-10-CM | POA: Diagnosis not present

## 2020-06-29 DIAGNOSIS — Z23 Encounter for immunization: Secondary | ICD-10-CM | POA: Diagnosis not present

## 2020-06-29 DIAGNOSIS — E785 Hyperlipidemia, unspecified: Secondary | ICD-10-CM | POA: Diagnosis not present

## 2020-06-29 DIAGNOSIS — I10 Essential (primary) hypertension: Secondary | ICD-10-CM | POA: Diagnosis not present

## 2020-06-29 DIAGNOSIS — G40909 Epilepsy, unspecified, not intractable, without status epilepticus: Secondary | ICD-10-CM | POA: Diagnosis not present

## 2020-06-29 DIAGNOSIS — E0590 Thyrotoxicosis, unspecified without thyrotoxic crisis or storm: Secondary | ICD-10-CM | POA: Diagnosis not present

## 2020-07-17 DIAGNOSIS — E785 Hyperlipidemia, unspecified: Secondary | ICD-10-CM | POA: Diagnosis not present

## 2020-07-17 DIAGNOSIS — I10 Essential (primary) hypertension: Secondary | ICD-10-CM | POA: Diagnosis not present

## 2020-07-22 DIAGNOSIS — H25813 Combined forms of age-related cataract, bilateral: Secondary | ICD-10-CM | POA: Diagnosis not present

## 2020-07-22 DIAGNOSIS — H04123 Dry eye syndrome of bilateral lacrimal glands: Secondary | ICD-10-CM | POA: Diagnosis not present

## 2020-07-22 DIAGNOSIS — H10413 Chronic giant papillary conjunctivitis, bilateral: Secondary | ICD-10-CM | POA: Diagnosis not present

## 2020-07-22 DIAGNOSIS — H40013 Open angle with borderline findings, low risk, bilateral: Secondary | ICD-10-CM | POA: Diagnosis not present

## 2020-07-22 DIAGNOSIS — H353131 Nonexudative age-related macular degeneration, bilateral, early dry stage: Secondary | ICD-10-CM | POA: Diagnosis not present

## 2020-09-02 DIAGNOSIS — I10 Essential (primary) hypertension: Secondary | ICD-10-CM | POA: Diagnosis not present

## 2020-09-02 DIAGNOSIS — E785 Hyperlipidemia, unspecified: Secondary | ICD-10-CM | POA: Diagnosis not present

## 2020-11-12 DIAGNOSIS — I10 Essential (primary) hypertension: Secondary | ICD-10-CM | POA: Diagnosis not present

## 2020-11-12 DIAGNOSIS — E0590 Thyrotoxicosis, unspecified without thyrotoxic crisis or storm: Secondary | ICD-10-CM | POA: Diagnosis not present

## 2020-11-12 DIAGNOSIS — Z23 Encounter for immunization: Secondary | ICD-10-CM | POA: Diagnosis not present

## 2020-11-19 DIAGNOSIS — E785 Hyperlipidemia, unspecified: Secondary | ICD-10-CM | POA: Diagnosis not present

## 2020-11-19 DIAGNOSIS — I10 Essential (primary) hypertension: Secondary | ICD-10-CM | POA: Diagnosis not present

## 2020-12-08 DIAGNOSIS — E0590 Thyrotoxicosis, unspecified without thyrotoxic crisis or storm: Secondary | ICD-10-CM | POA: Diagnosis not present

## 2020-12-16 DIAGNOSIS — E0590 Thyrotoxicosis, unspecified without thyrotoxic crisis or storm: Secondary | ICD-10-CM | POA: Diagnosis not present

## 2020-12-16 DIAGNOSIS — L409 Psoriasis, unspecified: Secondary | ICD-10-CM | POA: Diagnosis not present

## 2020-12-16 DIAGNOSIS — M069 Rheumatoid arthritis, unspecified: Secondary | ICD-10-CM | POA: Diagnosis not present

## 2020-12-16 DIAGNOSIS — I10 Essential (primary) hypertension: Secondary | ICD-10-CM | POA: Diagnosis not present

## 2021-01-05 DIAGNOSIS — Z1159 Encounter for screening for other viral diseases: Secondary | ICD-10-CM | POA: Diagnosis not present

## 2021-01-05 DIAGNOSIS — Z0000 Encounter for general adult medical examination without abnormal findings: Secondary | ICD-10-CM | POA: Diagnosis not present

## 2021-01-05 DIAGNOSIS — I10 Essential (primary) hypertension: Secondary | ICD-10-CM | POA: Diagnosis not present

## 2021-01-05 DIAGNOSIS — E0590 Thyrotoxicosis, unspecified without thyrotoxic crisis or storm: Secondary | ICD-10-CM | POA: Diagnosis not present

## 2021-01-05 DIAGNOSIS — G40909 Epilepsy, unspecified, not intractable, without status epilepticus: Secondary | ICD-10-CM | POA: Diagnosis not present

## 2021-01-05 DIAGNOSIS — E2839 Other primary ovarian failure: Secondary | ICD-10-CM | POA: Diagnosis not present

## 2021-01-05 DIAGNOSIS — E785 Hyperlipidemia, unspecified: Secondary | ICD-10-CM | POA: Diagnosis not present

## 2021-01-05 DIAGNOSIS — Z1231 Encounter for screening mammogram for malignant neoplasm of breast: Secondary | ICD-10-CM | POA: Diagnosis not present

## 2021-01-06 DIAGNOSIS — E785 Hyperlipidemia, unspecified: Secondary | ICD-10-CM | POA: Diagnosis not present

## 2021-01-06 DIAGNOSIS — I10 Essential (primary) hypertension: Secondary | ICD-10-CM | POA: Diagnosis not present

## 2021-01-08 ENCOUNTER — Other Ambulatory Visit: Payer: Self-pay | Admitting: Family Medicine

## 2021-01-08 DIAGNOSIS — E2839 Other primary ovarian failure: Secondary | ICD-10-CM

## 2021-01-08 DIAGNOSIS — Z1231 Encounter for screening mammogram for malignant neoplasm of breast: Secondary | ICD-10-CM

## 2021-02-15 DIAGNOSIS — E0590 Thyrotoxicosis, unspecified without thyrotoxic crisis or storm: Secondary | ICD-10-CM | POA: Diagnosis not present

## 2021-02-18 ENCOUNTER — Ambulatory Visit
Admission: RE | Admit: 2021-02-18 | Discharge: 2021-02-18 | Disposition: A | Discharge status: Home / Self Care | Payer: Medicare HMO | Source: Ambulatory Visit | Attending: Family Medicine | Admitting: Family Medicine

## 2021-02-18 DIAGNOSIS — Z1231 Encounter for screening mammogram for malignant neoplasm of breast: Secondary | ICD-10-CM | POA: Diagnosis not present

## 2021-02-19 ENCOUNTER — Other Ambulatory Visit: Payer: Self-pay | Admitting: Family Medicine

## 2021-02-19 DIAGNOSIS — R928 Other abnormal and inconclusive findings on diagnostic imaging of breast: Secondary | ICD-10-CM

## 2021-02-23 DIAGNOSIS — E785 Hyperlipidemia, unspecified: Secondary | ICD-10-CM | POA: Diagnosis not present

## 2021-02-23 DIAGNOSIS — I10 Essential (primary) hypertension: Secondary | ICD-10-CM | POA: Diagnosis not present

## 2021-03-18 ENCOUNTER — Other Ambulatory Visit: Payer: Medicare HMO

## 2021-04-16 ENCOUNTER — Ambulatory Visit
Admission: RE | Admit: 2021-04-16 | Discharge: 2021-04-16 | Disposition: A | Discharge status: Home / Self Care | Payer: Medicare HMO | Source: Ambulatory Visit | Attending: Family Medicine | Admitting: Family Medicine

## 2021-04-16 ENCOUNTER — Ambulatory Visit: Payer: Medicare HMO

## 2021-04-16 ENCOUNTER — Other Ambulatory Visit: Payer: Self-pay

## 2021-04-16 DIAGNOSIS — R928 Other abnormal and inconclusive findings on diagnostic imaging of breast: Secondary | ICD-10-CM

## 2021-04-16 DIAGNOSIS — N6002 Solitary cyst of left breast: Secondary | ICD-10-CM | POA: Diagnosis not present

## 2021-04-16 DIAGNOSIS — R922 Inconclusive mammogram: Secondary | ICD-10-CM | POA: Diagnosis not present

## 2021-04-21 DIAGNOSIS — R300 Dysuria: Secondary | ICD-10-CM | POA: Diagnosis not present

## 2021-04-21 DIAGNOSIS — N765 Ulceration of vagina: Secondary | ICD-10-CM | POA: Diagnosis not present

## 2021-05-04 DIAGNOSIS — H43812 Vitreous degeneration, left eye: Secondary | ICD-10-CM | POA: Diagnosis not present

## 2021-06-11 ENCOUNTER — Ambulatory Visit
Admission: RE | Admit: 2021-06-11 | Discharge: 2021-06-11 | Disposition: A | Discharge status: Home / Self Care | Payer: Medicare HMO | Source: Ambulatory Visit | Attending: Family Medicine | Admitting: Family Medicine

## 2021-06-11 DIAGNOSIS — Z780 Asymptomatic menopausal state: Secondary | ICD-10-CM | POA: Diagnosis not present

## 2021-06-11 DIAGNOSIS — E2839 Other primary ovarian failure: Secondary | ICD-10-CM

## 2021-06-11 DIAGNOSIS — M85851 Other specified disorders of bone density and structure, right thigh: Secondary | ICD-10-CM | POA: Diagnosis not present

## 2021-06-15 DIAGNOSIS — E0590 Thyrotoxicosis, unspecified without thyrotoxic crisis or storm: Secondary | ICD-10-CM | POA: Diagnosis not present

## 2021-06-15 DIAGNOSIS — I10 Essential (primary) hypertension: Secondary | ICD-10-CM | POA: Diagnosis not present

## 2021-06-15 DIAGNOSIS — M069 Rheumatoid arthritis, unspecified: Secondary | ICD-10-CM | POA: Diagnosis not present

## 2021-06-23 DIAGNOSIS — E0590 Thyrotoxicosis, unspecified without thyrotoxic crisis or storm: Secondary | ICD-10-CM | POA: Diagnosis not present

## 2021-07-07 DIAGNOSIS — E0590 Thyrotoxicosis, unspecified without thyrotoxic crisis or storm: Secondary | ICD-10-CM | POA: Diagnosis not present

## 2021-07-07 DIAGNOSIS — I10 Essential (primary) hypertension: Secondary | ICD-10-CM | POA: Diagnosis not present

## 2021-07-07 DIAGNOSIS — E785 Hyperlipidemia, unspecified: Secondary | ICD-10-CM | POA: Diagnosis not present

## 2021-07-07 DIAGNOSIS — G40909 Epilepsy, unspecified, not intractable, without status epilepticus: Secondary | ICD-10-CM | POA: Diagnosis not present

## 2021-07-07 DIAGNOSIS — B009 Herpesviral infection, unspecified: Secondary | ICD-10-CM | POA: Diagnosis not present

## 2021-07-27 DIAGNOSIS — H10413 Chronic giant papillary conjunctivitis, bilateral: Secondary | ICD-10-CM | POA: Diagnosis not present

## 2021-07-27 DIAGNOSIS — H40013 Open angle with borderline findings, low risk, bilateral: Secondary | ICD-10-CM | POA: Diagnosis not present

## 2021-07-27 DIAGNOSIS — H43812 Vitreous degeneration, left eye: Secondary | ICD-10-CM | POA: Diagnosis not present

## 2021-07-27 DIAGNOSIS — H35712 Central serous chorioretinopathy, left eye: Secondary | ICD-10-CM | POA: Diagnosis not present

## 2021-07-27 DIAGNOSIS — H25813 Combined forms of age-related cataract, bilateral: Secondary | ICD-10-CM | POA: Diagnosis not present

## 2021-07-27 DIAGNOSIS — H353131 Nonexudative age-related macular degeneration, bilateral, early dry stage: Secondary | ICD-10-CM | POA: Diagnosis not present

## 2021-07-27 DIAGNOSIS — H04123 Dry eye syndrome of bilateral lacrimal glands: Secondary | ICD-10-CM | POA: Diagnosis not present

## 2021-07-28 ENCOUNTER — Encounter (INDEPENDENT_AMBULATORY_CARE_PROVIDER_SITE_OTHER): Payer: Self-pay | Admitting: Ophthalmology

## 2021-07-29 ENCOUNTER — Encounter (INDEPENDENT_AMBULATORY_CARE_PROVIDER_SITE_OTHER): Payer: Medicare HMO | Admitting: Ophthalmology

## 2021-07-29 DIAGNOSIS — H2513 Age-related nuclear cataract, bilateral: Secondary | ICD-10-CM | POA: Diagnosis not present

## 2021-07-29 DIAGNOSIS — H35033 Hypertensive retinopathy, bilateral: Secondary | ICD-10-CM

## 2021-07-29 DIAGNOSIS — H353221 Exudative age-related macular degeneration, left eye, with active choroidal neovascularization: Secondary | ICD-10-CM | POA: Diagnosis not present

## 2021-07-29 DIAGNOSIS — I10 Essential (primary) hypertension: Secondary | ICD-10-CM

## 2021-07-29 DIAGNOSIS — H43813 Vitreous degeneration, bilateral: Secondary | ICD-10-CM | POA: Diagnosis not present

## 2021-08-26 ENCOUNTER — Encounter (INDEPENDENT_AMBULATORY_CARE_PROVIDER_SITE_OTHER): Payer: Medicare HMO | Admitting: Ophthalmology

## 2021-08-26 DIAGNOSIS — H353221 Exudative age-related macular degeneration, left eye, with active choroidal neovascularization: Secondary | ICD-10-CM

## 2021-08-26 DIAGNOSIS — H35033 Hypertensive retinopathy, bilateral: Secondary | ICD-10-CM | POA: Diagnosis not present

## 2021-08-26 DIAGNOSIS — I10 Essential (primary) hypertension: Secondary | ICD-10-CM | POA: Diagnosis not present

## 2021-08-26 DIAGNOSIS — H43813 Vitreous degeneration, bilateral: Secondary | ICD-10-CM | POA: Diagnosis not present

## 2021-08-26 DIAGNOSIS — H353112 Nonexudative age-related macular degeneration, right eye, intermediate dry stage: Secondary | ICD-10-CM

## 2021-08-30 DIAGNOSIS — I10 Essential (primary) hypertension: Secondary | ICD-10-CM | POA: Diagnosis not present

## 2021-08-30 DIAGNOSIS — E785 Hyperlipidemia, unspecified: Secondary | ICD-10-CM | POA: Diagnosis not present

## 2021-09-23 ENCOUNTER — Encounter (INDEPENDENT_AMBULATORY_CARE_PROVIDER_SITE_OTHER): Payer: Medicare HMO | Admitting: Ophthalmology

## 2021-09-23 DIAGNOSIS — I10 Essential (primary) hypertension: Secondary | ICD-10-CM

## 2021-09-23 DIAGNOSIS — H35033 Hypertensive retinopathy, bilateral: Secondary | ICD-10-CM | POA: Diagnosis not present

## 2021-09-23 DIAGNOSIS — H43813 Vitreous degeneration, bilateral: Secondary | ICD-10-CM | POA: Diagnosis not present

## 2021-09-23 DIAGNOSIS — H353221 Exudative age-related macular degeneration, left eye, with active choroidal neovascularization: Secondary | ICD-10-CM | POA: Diagnosis not present

## 2021-09-23 DIAGNOSIS — H353111 Nonexudative age-related macular degeneration, right eye, early dry stage: Secondary | ICD-10-CM | POA: Diagnosis not present

## 2021-10-14 DIAGNOSIS — Z23 Encounter for immunization: Secondary | ICD-10-CM | POA: Diagnosis not present

## 2021-10-21 ENCOUNTER — Encounter (INDEPENDENT_AMBULATORY_CARE_PROVIDER_SITE_OTHER): Payer: Medicare HMO | Admitting: Ophthalmology

## 2021-10-21 DIAGNOSIS — H00014 Hordeolum externum left upper eyelid: Secondary | ICD-10-CM | POA: Diagnosis not present

## 2021-10-21 DIAGNOSIS — I10 Essential (primary) hypertension: Secondary | ICD-10-CM | POA: Diagnosis not present

## 2021-10-21 DIAGNOSIS — H35033 Hypertensive retinopathy, bilateral: Secondary | ICD-10-CM

## 2021-10-21 DIAGNOSIS — H353111 Nonexudative age-related macular degeneration, right eye, early dry stage: Secondary | ICD-10-CM | POA: Diagnosis not present

## 2021-10-21 DIAGNOSIS — H353221 Exudative age-related macular degeneration, left eye, with active choroidal neovascularization: Secondary | ICD-10-CM | POA: Diagnosis not present

## 2021-10-21 DIAGNOSIS — H43813 Vitreous degeneration, bilateral: Secondary | ICD-10-CM | POA: Diagnosis not present

## 2021-11-04 ENCOUNTER — Encounter (INDEPENDENT_AMBULATORY_CARE_PROVIDER_SITE_OTHER): Payer: Medicare HMO | Admitting: Ophthalmology

## 2021-11-08 ENCOUNTER — Encounter (INDEPENDENT_AMBULATORY_CARE_PROVIDER_SITE_OTHER): Payer: Medicare HMO | Admitting: Ophthalmology

## 2021-11-08 DIAGNOSIS — H353111 Nonexudative age-related macular degeneration, right eye, early dry stage: Secondary | ICD-10-CM | POA: Diagnosis not present

## 2021-11-08 DIAGNOSIS — H353221 Exudative age-related macular degeneration, left eye, with active choroidal neovascularization: Secondary | ICD-10-CM | POA: Diagnosis not present

## 2021-11-08 DIAGNOSIS — H35033 Hypertensive retinopathy, bilateral: Secondary | ICD-10-CM

## 2021-11-08 DIAGNOSIS — I10 Essential (primary) hypertension: Secondary | ICD-10-CM

## 2021-11-08 DIAGNOSIS — H43813 Vitreous degeneration, bilateral: Secondary | ICD-10-CM

## 2021-12-13 ENCOUNTER — Encounter (INDEPENDENT_AMBULATORY_CARE_PROVIDER_SITE_OTHER): Payer: Medicare HMO | Admitting: Ophthalmology

## 2021-12-13 DIAGNOSIS — I10 Essential (primary) hypertension: Secondary | ICD-10-CM

## 2021-12-13 DIAGNOSIS — H35033 Hypertensive retinopathy, bilateral: Secondary | ICD-10-CM | POA: Diagnosis not present

## 2021-12-13 DIAGNOSIS — H353112 Nonexudative age-related macular degeneration, right eye, intermediate dry stage: Secondary | ICD-10-CM | POA: Diagnosis not present

## 2021-12-13 DIAGNOSIS — H353221 Exudative age-related macular degeneration, left eye, with active choroidal neovascularization: Secondary | ICD-10-CM | POA: Diagnosis not present

## 2021-12-13 DIAGNOSIS — H43813 Vitreous degeneration, bilateral: Secondary | ICD-10-CM

## 2021-12-16 DIAGNOSIS — E0590 Thyrotoxicosis, unspecified without thyrotoxic crisis or storm: Secondary | ICD-10-CM | POA: Diagnosis not present

## 2021-12-23 DIAGNOSIS — L409 Psoriasis, unspecified: Secondary | ICD-10-CM | POA: Diagnosis not present

## 2021-12-23 DIAGNOSIS — E0590 Thyrotoxicosis, unspecified without thyrotoxic crisis or storm: Secondary | ICD-10-CM | POA: Diagnosis not present

## 2021-12-23 DIAGNOSIS — I10 Essential (primary) hypertension: Secondary | ICD-10-CM | POA: Diagnosis not present

## 2021-12-23 DIAGNOSIS — D509 Iron deficiency anemia, unspecified: Secondary | ICD-10-CM | POA: Diagnosis not present

## 2022-01-14 ENCOUNTER — Encounter (INDEPENDENT_AMBULATORY_CARE_PROVIDER_SITE_OTHER): Payer: Medicare HMO | Admitting: Ophthalmology

## 2022-01-14 DIAGNOSIS — H35033 Hypertensive retinopathy, bilateral: Secondary | ICD-10-CM

## 2022-01-14 DIAGNOSIS — H353112 Nonexudative age-related macular degeneration, right eye, intermediate dry stage: Secondary | ICD-10-CM

## 2022-01-14 DIAGNOSIS — H353221 Exudative age-related macular degeneration, left eye, with active choroidal neovascularization: Secondary | ICD-10-CM | POA: Diagnosis not present

## 2022-01-14 DIAGNOSIS — I10 Essential (primary) hypertension: Secondary | ICD-10-CM | POA: Diagnosis not present

## 2022-01-14 DIAGNOSIS — H43813 Vitreous degeneration, bilateral: Secondary | ICD-10-CM

## 2022-01-18 DIAGNOSIS — E0590 Thyrotoxicosis, unspecified without thyrotoxic crisis or storm: Secondary | ICD-10-CM | POA: Diagnosis not present

## 2022-01-18 DIAGNOSIS — E785 Hyperlipidemia, unspecified: Secondary | ICD-10-CM | POA: Diagnosis not present

## 2022-01-18 DIAGNOSIS — G40909 Epilepsy, unspecified, not intractable, without status epilepticus: Secondary | ICD-10-CM | POA: Diagnosis not present

## 2022-01-18 DIAGNOSIS — Z0000 Encounter for general adult medical examination without abnormal findings: Secondary | ICD-10-CM | POA: Diagnosis not present

## 2022-01-18 DIAGNOSIS — A6000 Herpesviral infection of urogenital system, unspecified: Secondary | ICD-10-CM | POA: Diagnosis not present

## 2022-01-18 DIAGNOSIS — Z202 Contact with and (suspected) exposure to infections with a predominantly sexual mode of transmission: Secondary | ICD-10-CM | POA: Diagnosis not present

## 2022-01-18 DIAGNOSIS — I10 Essential (primary) hypertension: Secondary | ICD-10-CM | POA: Diagnosis not present

## 2022-01-18 DIAGNOSIS — G3184 Mild cognitive impairment, so stated: Secondary | ICD-10-CM | POA: Diagnosis not present

## 2022-01-24 ENCOUNTER — Other Ambulatory Visit: Payer: Self-pay | Admitting: Family Medicine

## 2022-01-24 DIAGNOSIS — G3184 Mild cognitive impairment, so stated: Secondary | ICD-10-CM

## 2022-01-24 DIAGNOSIS — H25813 Combined forms of age-related cataract, bilateral: Secondary | ICD-10-CM | POA: Diagnosis not present

## 2022-01-24 DIAGNOSIS — H40013 Open angle with borderline findings, low risk, bilateral: Secondary | ICD-10-CM | POA: Diagnosis not present

## 2022-01-24 DIAGNOSIS — H10413 Chronic giant papillary conjunctivitis, bilateral: Secondary | ICD-10-CM | POA: Diagnosis not present

## 2022-01-24 DIAGNOSIS — H35712 Central serous chorioretinopathy, left eye: Secondary | ICD-10-CM | POA: Diagnosis not present

## 2022-01-24 DIAGNOSIS — H353131 Nonexudative age-related macular degeneration, bilateral, early dry stage: Secondary | ICD-10-CM | POA: Diagnosis not present

## 2022-01-24 DIAGNOSIS — H04123 Dry eye syndrome of bilateral lacrimal glands: Secondary | ICD-10-CM | POA: Diagnosis not present

## 2022-01-24 DIAGNOSIS — H43813 Vitreous degeneration, bilateral: Secondary | ICD-10-CM | POA: Diagnosis not present

## 2022-02-11 ENCOUNTER — Encounter (INDEPENDENT_AMBULATORY_CARE_PROVIDER_SITE_OTHER): Payer: Medicare HMO | Admitting: Ophthalmology

## 2022-02-11 DIAGNOSIS — H43813 Vitreous degeneration, bilateral: Secondary | ICD-10-CM

## 2022-02-11 DIAGNOSIS — H353112 Nonexudative age-related macular degeneration, right eye, intermediate dry stage: Secondary | ICD-10-CM

## 2022-02-11 DIAGNOSIS — I10 Essential (primary) hypertension: Secondary | ICD-10-CM

## 2022-02-11 DIAGNOSIS — H35033 Hypertensive retinopathy, bilateral: Secondary | ICD-10-CM | POA: Diagnosis not present

## 2022-02-11 DIAGNOSIS — H353221 Exudative age-related macular degeneration, left eye, with active choroidal neovascularization: Secondary | ICD-10-CM

## 2022-02-22 DIAGNOSIS — E0590 Thyrotoxicosis, unspecified without thyrotoxic crisis or storm: Secondary | ICD-10-CM | POA: Diagnosis not present

## 2022-02-24 DIAGNOSIS — R051 Acute cough: Secondary | ICD-10-CM | POA: Diagnosis not present

## 2022-02-24 DIAGNOSIS — U071 COVID-19: Secondary | ICD-10-CM | POA: Diagnosis not present

## 2022-02-26 ENCOUNTER — Other Ambulatory Visit: Payer: Medicare HMO

## 2022-03-11 ENCOUNTER — Encounter (INDEPENDENT_AMBULATORY_CARE_PROVIDER_SITE_OTHER): Payer: Medicare HMO | Admitting: Ophthalmology

## 2022-03-11 DIAGNOSIS — H43813 Vitreous degeneration, bilateral: Secondary | ICD-10-CM

## 2022-03-11 DIAGNOSIS — H35033 Hypertensive retinopathy, bilateral: Secondary | ICD-10-CM

## 2022-03-11 DIAGNOSIS — H353221 Exudative age-related macular degeneration, left eye, with active choroidal neovascularization: Secondary | ICD-10-CM

## 2022-03-11 DIAGNOSIS — I10 Essential (primary) hypertension: Secondary | ICD-10-CM | POA: Diagnosis not present

## 2022-03-11 DIAGNOSIS — H353111 Nonexudative age-related macular degeneration, right eye, early dry stage: Secondary | ICD-10-CM | POA: Diagnosis not present

## 2022-04-08 ENCOUNTER — Encounter (INDEPENDENT_AMBULATORY_CARE_PROVIDER_SITE_OTHER): Payer: Medicare HMO | Admitting: Ophthalmology

## 2022-04-08 DIAGNOSIS — I10 Essential (primary) hypertension: Secondary | ICD-10-CM

## 2022-04-08 DIAGNOSIS — H353221 Exudative age-related macular degeneration, left eye, with active choroidal neovascularization: Secondary | ICD-10-CM | POA: Diagnosis not present

## 2022-04-08 DIAGNOSIS — H35033 Hypertensive retinopathy, bilateral: Secondary | ICD-10-CM | POA: Diagnosis not present

## 2022-04-08 DIAGNOSIS — H43813 Vitreous degeneration, bilateral: Secondary | ICD-10-CM | POA: Diagnosis not present

## 2022-04-08 DIAGNOSIS — H353112 Nonexudative age-related macular degeneration, right eye, intermediate dry stage: Secondary | ICD-10-CM | POA: Diagnosis not present

## 2022-05-09 ENCOUNTER — Encounter (INDEPENDENT_AMBULATORY_CARE_PROVIDER_SITE_OTHER): Payer: Medicare HMO | Admitting: Ophthalmology

## 2022-05-09 DIAGNOSIS — H353112 Nonexudative age-related macular degeneration, right eye, intermediate dry stage: Secondary | ICD-10-CM

## 2022-05-09 DIAGNOSIS — H35033 Hypertensive retinopathy, bilateral: Secondary | ICD-10-CM | POA: Diagnosis not present

## 2022-05-09 DIAGNOSIS — I10 Essential (primary) hypertension: Secondary | ICD-10-CM | POA: Diagnosis not present

## 2022-05-09 DIAGNOSIS — H353221 Exudative age-related macular degeneration, left eye, with active choroidal neovascularization: Secondary | ICD-10-CM

## 2022-05-09 DIAGNOSIS — H43813 Vitreous degeneration, bilateral: Secondary | ICD-10-CM

## 2022-05-18 IMAGING — MG DIGITAL DIAGNOSTIC BILAT W/ TOMO W/ CAD
8 of 14 series · 8 of 40 positions shown · non-contrast
Comparison: Previous examinations, including the screening
mammogram dated 02/18/2021.

CLINICAL DATA: Possible mass in the posterior outer right breast
and possible mass in the medial retroareolar left breast on a recent
screening mammogram.

EXAM:
DIGITAL DIAGNOSTIC BILATERAL MAMMOGRAM WITH TOMOSYNTHESIS AND CAD;
ULTRASOUND LEFT BREAST LIMITED
TECHNIQUE: Bilateral digital diagnostic mammography and breast tomosynthesis
was performed. The images were evaluated with computer-aided
detection.; Targeted ultrasound examination of the left breast was
performed.

[L MLO synth-2D]
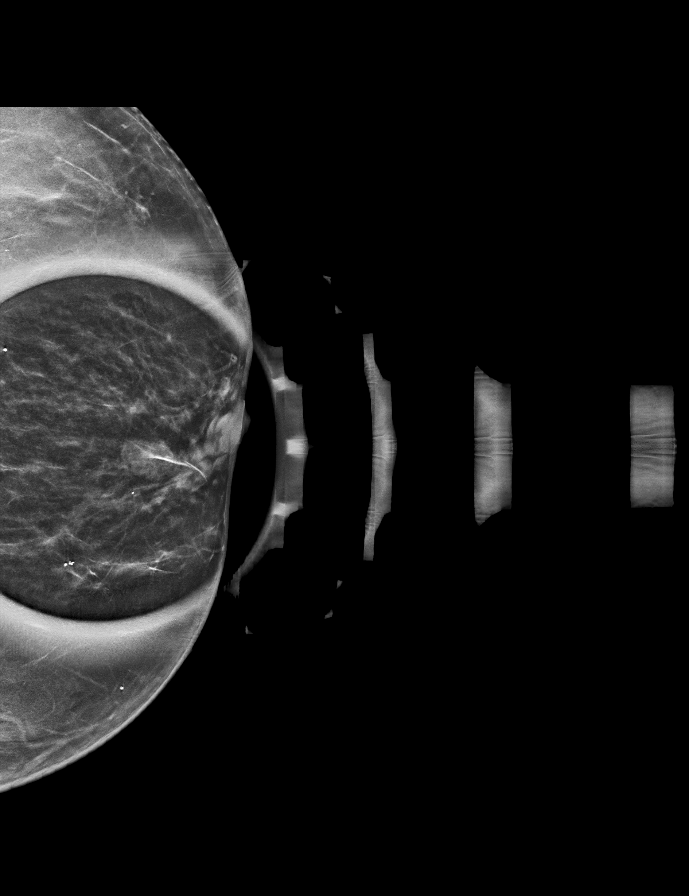

[R MLO synth-2D (1 of 3)]
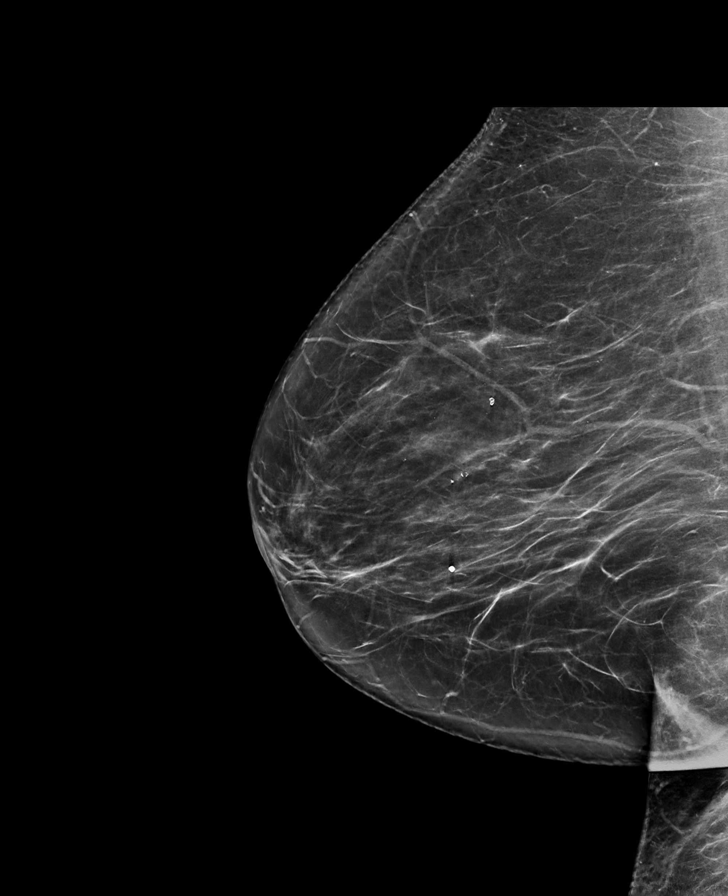

[R CC synth-2D (1 of 2)]
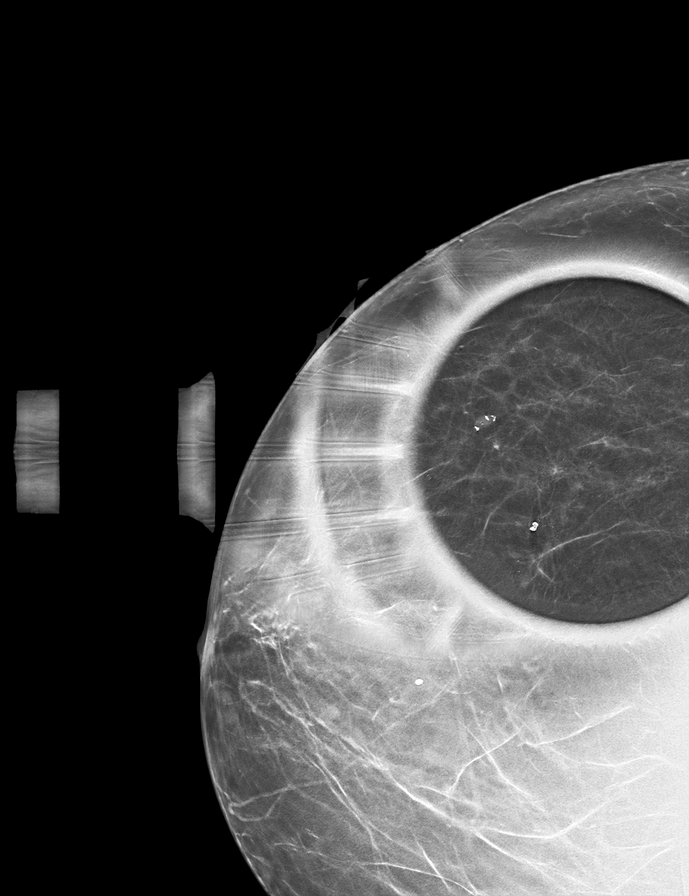

[R CC synth-2D (2 of 2)]
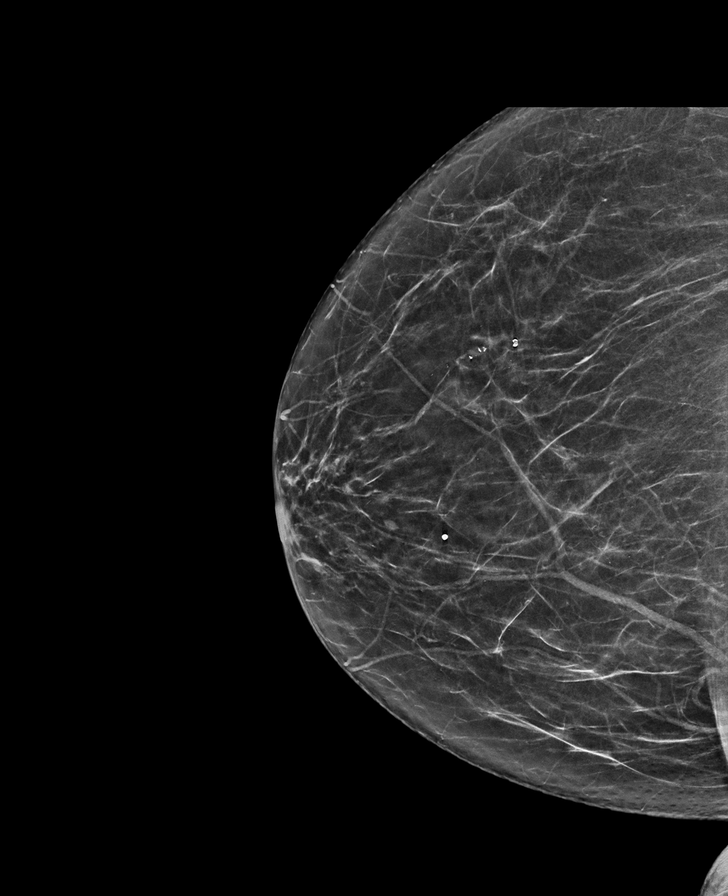

[R MLO synth-2D (2 of 3)]
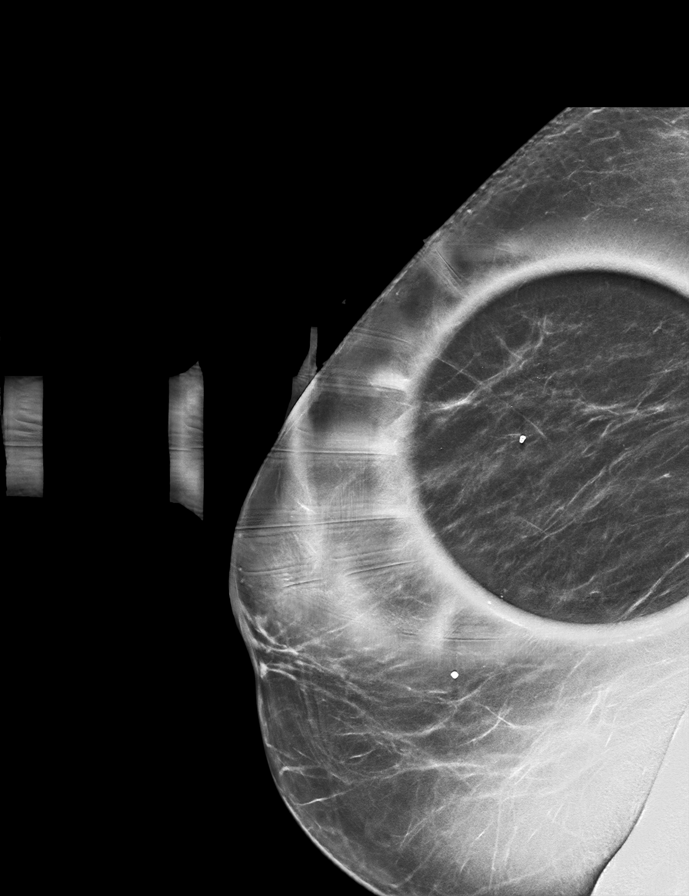

[L CC synth-2D]
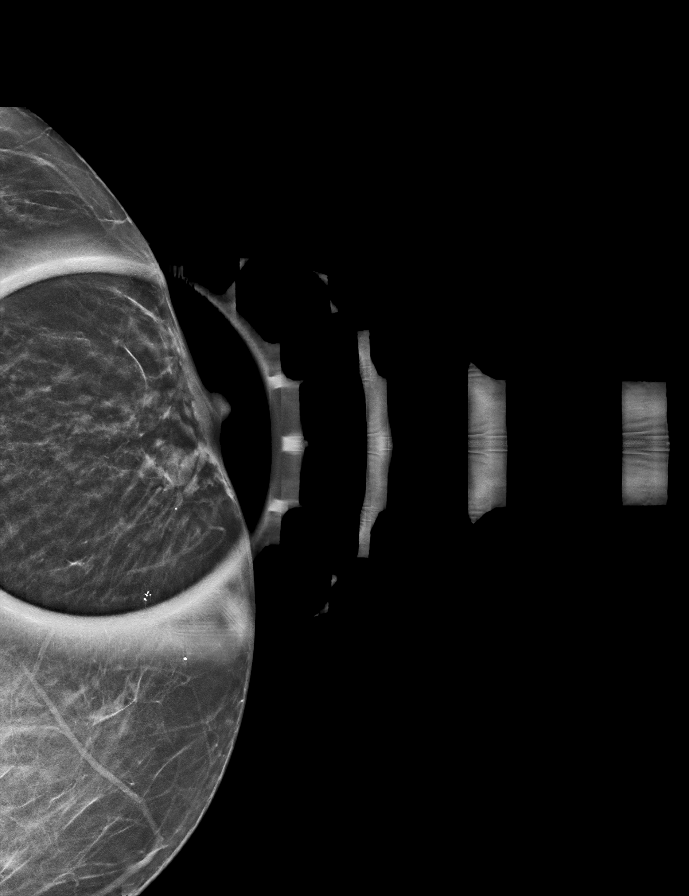

[R MLO synth-2D (3 of 3)]
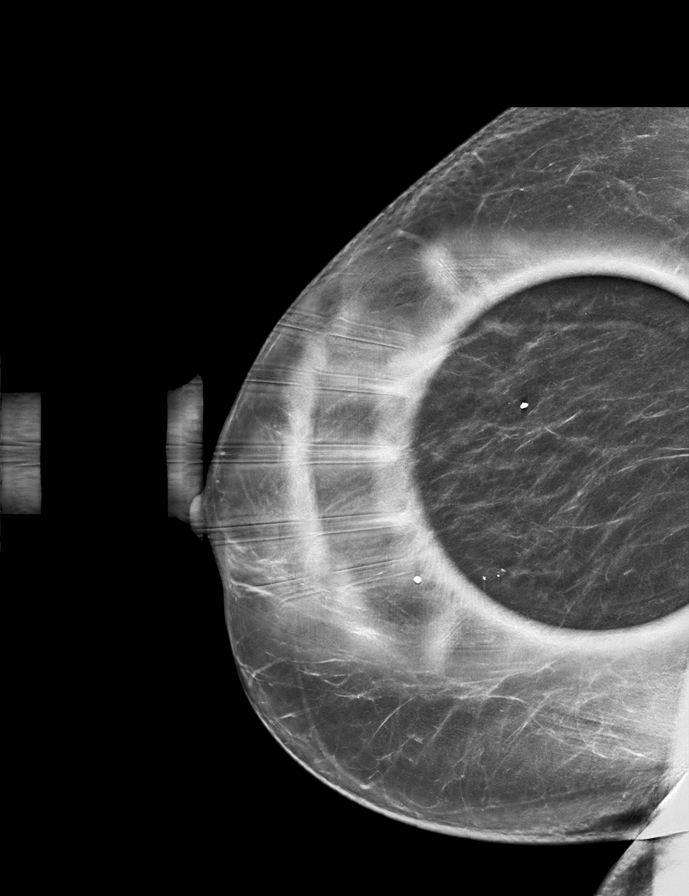

[R MLO tomo · tomo slice 34/67.0]
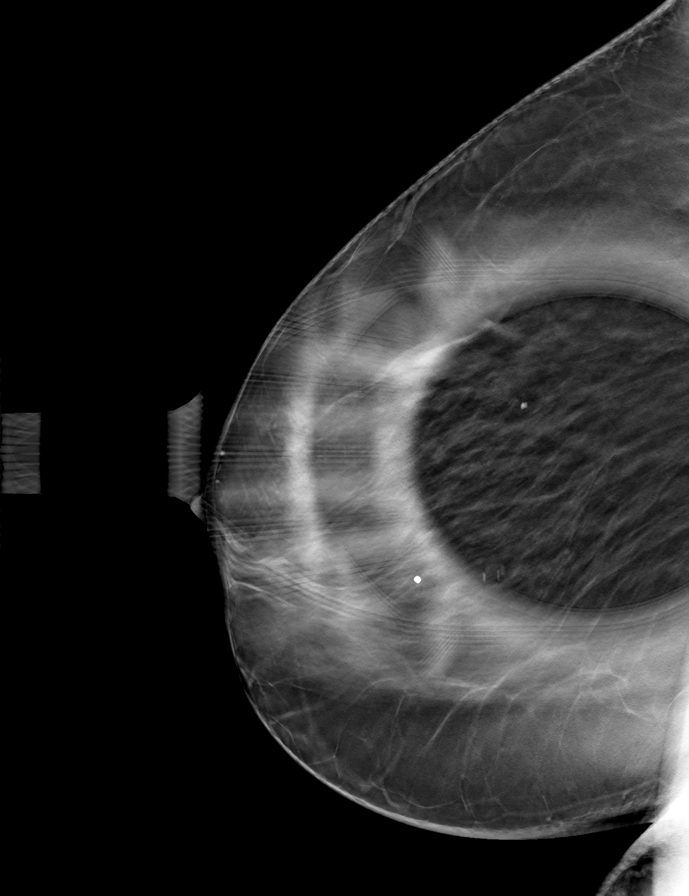

[8 of 40 positions shown; findings below may reference images not displayed]

ACR Breast Density Category b: There are scattered areas of
fibroglandular density.
FINDINGS: 3D tomographic and 2D generated spot compression images of both
breasts were obtained. These confirm a 1.5 cm gently lobulated,
circumscribed, oval mass in the medial retroareolar left breast.

The small, oval, circumscribed mass seen in the posterior outer
right breast on the screening mammogram is no longer visualized.

Targeted ultrasound is performed, showing a 1.3 cm cyst with thin
and mildly thickened internal septations in the 9 o'clock
retroareolar left breast, corresponding to the mammographic mass. No
internal nodularity or internal blood flow with power Doppler was
demonstrated.
IMPRESSION: 1. 1.3 cm benign left breast cyst.
2. Resolved right breast mass, compatible with a cyst which lost its
fluid in the interim.
3. No evidence of malignancy in either breast.

RECOMMENDATION:
Bilateral screening mammogram in February 2022 when due.

I have discussed the findings and recommendations with the patient.
If applicable, a reminder letter will be sent to the patient
regarding the next appointment.

BI-RADS CATEGORY  2: Benign.

## 2022-05-31 ENCOUNTER — Other Ambulatory Visit: Payer: Medicare HMO

## 2022-05-31 ENCOUNTER — Ambulatory Visit
Admission: RE | Admit: 2022-05-31 | Discharge: 2022-05-31 | Disposition: A | Discharge status: Home / Self Care | Payer: Medicare HMO | Source: Ambulatory Visit | Attending: Family Medicine | Admitting: Family Medicine

## 2022-05-31 DIAGNOSIS — G3184 Mild cognitive impairment, so stated: Secondary | ICD-10-CM

## 2022-06-13 ENCOUNTER — Encounter (INDEPENDENT_AMBULATORY_CARE_PROVIDER_SITE_OTHER): Payer: Medicare HMO | Admitting: Ophthalmology

## 2022-06-13 DIAGNOSIS — H2513 Age-related nuclear cataract, bilateral: Secondary | ICD-10-CM

## 2022-06-13 DIAGNOSIS — I10 Essential (primary) hypertension: Secondary | ICD-10-CM | POA: Diagnosis not present

## 2022-06-13 DIAGNOSIS — H353112 Nonexudative age-related macular degeneration, right eye, intermediate dry stage: Secondary | ICD-10-CM

## 2022-06-13 DIAGNOSIS — H353221 Exudative age-related macular degeneration, left eye, with active choroidal neovascularization: Secondary | ICD-10-CM | POA: Diagnosis not present

## 2022-06-13 DIAGNOSIS — H35033 Hypertensive retinopathy, bilateral: Secondary | ICD-10-CM | POA: Diagnosis not present

## 2022-06-13 DIAGNOSIS — H43813 Vitreous degeneration, bilateral: Secondary | ICD-10-CM

## 2022-06-23 DIAGNOSIS — E0590 Thyrotoxicosis, unspecified without thyrotoxic crisis or storm: Secondary | ICD-10-CM | POA: Diagnosis not present

## 2022-06-30 DIAGNOSIS — L409 Psoriasis, unspecified: Secondary | ICD-10-CM | POA: Diagnosis not present

## 2022-06-30 DIAGNOSIS — E0590 Thyrotoxicosis, unspecified without thyrotoxic crisis or storm: Secondary | ICD-10-CM | POA: Diagnosis not present

## 2022-06-30 DIAGNOSIS — I10 Essential (primary) hypertension: Secondary | ICD-10-CM | POA: Diagnosis not present

## 2022-06-30 DIAGNOSIS — D509 Iron deficiency anemia, unspecified: Secondary | ICD-10-CM | POA: Diagnosis not present

## 2022-07-18 ENCOUNTER — Encounter (INDEPENDENT_AMBULATORY_CARE_PROVIDER_SITE_OTHER): Payer: Medicare HMO | Admitting: Ophthalmology

## 2022-07-18 DIAGNOSIS — H43813 Vitreous degeneration, bilateral: Secondary | ICD-10-CM | POA: Diagnosis not present

## 2022-07-18 DIAGNOSIS — H353221 Exudative age-related macular degeneration, left eye, with active choroidal neovascularization: Secondary | ICD-10-CM | POA: Diagnosis not present

## 2022-07-18 DIAGNOSIS — H35033 Hypertensive retinopathy, bilateral: Secondary | ICD-10-CM

## 2022-07-18 DIAGNOSIS — H353112 Nonexudative age-related macular degeneration, right eye, intermediate dry stage: Secondary | ICD-10-CM

## 2022-07-18 DIAGNOSIS — I10 Essential (primary) hypertension: Secondary | ICD-10-CM | POA: Diagnosis not present

## 2022-08-03 DIAGNOSIS — G40909 Epilepsy, unspecified, not intractable, without status epilepticus: Secondary | ICD-10-CM | POA: Diagnosis not present

## 2022-08-03 DIAGNOSIS — L439 Lichen planus, unspecified: Secondary | ICD-10-CM | POA: Diagnosis not present

## 2022-08-03 DIAGNOSIS — I10 Essential (primary) hypertension: Secondary | ICD-10-CM | POA: Diagnosis not present

## 2022-08-03 DIAGNOSIS — G3184 Mild cognitive impairment, so stated: Secondary | ICD-10-CM | POA: Diagnosis not present

## 2022-08-03 DIAGNOSIS — E669 Obesity, unspecified: Secondary | ICD-10-CM | POA: Diagnosis not present

## 2022-08-03 DIAGNOSIS — E785 Hyperlipidemia, unspecified: Secondary | ICD-10-CM | POA: Diagnosis not present

## 2022-08-03 DIAGNOSIS — A6000 Herpesviral infection of urogenital system, unspecified: Secondary | ICD-10-CM | POA: Diagnosis not present

## 2022-08-21 ENCOUNTER — Emergency Department (HOSPITAL_BASED_OUTPATIENT_CLINIC_OR_DEPARTMENT_OTHER)
Admission: EM | Admit: 2022-08-21 | Discharge: 2022-08-21 | Disposition: A | Discharge status: Home / Self Care | Payer: Medicare HMO | Attending: Emergency Medicine | Admitting: Emergency Medicine

## 2022-08-21 ENCOUNTER — Encounter (HOSPITAL_BASED_OUTPATIENT_CLINIC_OR_DEPARTMENT_OTHER): Payer: Self-pay | Admitting: Emergency Medicine

## 2022-08-21 ENCOUNTER — Other Ambulatory Visit: Payer: Self-pay

## 2022-08-21 DIAGNOSIS — L0291 Cutaneous abscess, unspecified: Secondary | ICD-10-CM

## 2022-08-21 DIAGNOSIS — L0211 Cutaneous abscess of neck: Secondary | ICD-10-CM | POA: Diagnosis not present

## 2022-08-21 MED ORDER — DOXYCYCLINE HYCLATE 100 MG PO CAPS: 100.0000 mg | ORAL_CAPSULE | Freq: Two times a day (BID) | ORAL | 0 refills | Status: DC

## 2022-08-21 MED ORDER — LIDOCAINE-EPINEPHRINE-TETRACAINE (LET) TOPICAL GEL
3.0000 mL | Freq: Once | TOPICAL | Status: AC
  Administered 2022-08-21: 3 mL via TOPICAL

## 2022-08-21 NOTE — Discharge Instructions (Signed)
It was a pleasure taking care of you here in the emergency department today  We drained the abscess in the back of your neck.  We have placed packing.  Warm compress to area.  We want to encourage draining.  We have also started on antibiotics.  Take as prescribed.  Follow-up with primary care provider within 1 week.  This is most likely an infected cyst and you do need the pocket removed.  He may need to follow-up with general surgery for this or dermatology.  Return for new or worsening symptoms.

## 2022-08-21 NOTE — ED Triage Notes (Signed)
Abscess vs Cyst on back of neck, seen by PCP for. Tonight opened up and was bleeding

## 2022-08-21 NOTE — ED Notes (Signed)
Ambulatory to restroom

## 2022-08-21 NOTE — ED Provider Notes (Signed)
Cassandra Jones Provider Note   CSN: [id number] Arrival date & time: 08/21/22  1914     History  Chief Complaint  Patient presents with   Abscess    Cassandra Jones is a 82 y.o. female for evaluation of abscess to posterior neck.  She has had a cyst to this area for quite some time.  She has had it drained by her PCP previously.  She states she was told it was an infected cyst.  Area has been bothering her last few days.  States it opened up and was using.  She denies any fever.  No pain with range of motion to neck.  No difficulty swallowing.  No recent injury or trauma.  HPI     Home Medications Prior to Admission medications   Medication Sig Start Date End Date Taking? Authorizing Provider  doxycycline (VIBRAMYCIN) 100 MG capsule Take 1 capsule (100 mg total) by mouth 2 (two) times daily. 08/21/22  Yes Cabrina Shiroma A, PA-C  amLODipine (NORVASC) 10 MG tablet Take 10 mg by mouth daily.    [provider]  Artificial Tear Ointment (EYE LUBRICANT OP) Apply to eye.    [provider]  calcium carbonate (OS-CAL - DOSED IN MG OF ELEMENTAL CALCIUM) 1250 (500 Ca) MG tablet Take 1 tablet by mouth.    [provider]  cephALEXin (KEFLEX) 500 MG capsule Take 1 capsule (500 mg total) by mouth 2 (two) times daily. 07/03/14   Szekalski, Yvonna Alanis, PA-C  ezetimibe (ZETIA) 10 MG tablet Take 10 mg by mouth daily.    [provider]  HYDROcodone-acetaminophen (NORCO/VICODIN) 5-325 MG tablet Take 1-2 tablets by mouth every 4 (four) hours as needed for moderate pain or severe pain. Patient not taking: Reported on 03/01/2017 03/07/15   Arby Barrette, MD  lisinopril-hydrochlorothiazide (PRINZIDE,ZESTORETIC) 20-25 MG tablet Take 1 tablet by mouth daily.    [provider]  methimazole (TAPAZOLE) 5 MG tablet  06/21/19   [provider]  Multiple Vitamins-Minerals (ICAPS AREDS 2 PO) Take by mouth.    [provider]  naproxen (NAPROSYN) 250 MG tablet Take 1 tablet (250 mg total) by mouth 2 (two) times daily with a meal. 09/01/19   Georgetta Haber, NP  Omega-3 Fatty Acids (FISH OIL) 1000 MG CAPS Take by mouth.    [provider]  phenytoin (DILANTIN) 100 MG ER capsule Take by mouth 3 (three) times daily.    [provider]      Allergies    Patient has no known allergies.    Review of Systems   Review of Systems  Constitutional: Negative.   HENT: Negative.    Respiratory: Negative.    Cardiovascular: Negative.   Gastrointestinal: Negative.   Genitourinary: Negative.   Skin:  Positive for wound.  Neurological: Negative.   All other systems reviewed and are negative.   Physical Exam Updated Vital Signs BP 133/70 (BP Location: Right Arm)   Pulse 87   Temp 98.4 F (36.9 C)   Resp 18   SpO2 97%  Physical Exam Vitals and nursing note reviewed.  Constitutional:      General: She is not in acute distress.    Appearance: She is well-developed. She is not ill-appearing, toxic-appearing or diaphoretic.  HENT:     Head: Normocephalic and atraumatic.  Eyes:     Pupils: Pupils are equal, round, and reactive to light.  Neck:     Trachea: Trachea and phonation  normal.      Comments: 2 cm circular mass with center pustule. Punctate oozing. No midline spinal tenderness, full range of motion without difficulty.  No rigidity. Cardiovascular:     Rate and Rhythm: Normal rate.  Pulmonary:     Effort: No respiratory distress.  Abdominal:     General: There is no distension.  Musculoskeletal:        General: Normal range of motion.     Cervical back: Full passive range of motion without pain and normal range of motion.  Skin:    General: Skin is warm and dry.  Neurological:     General: No focal deficit present.     Mental Status: She is alert.  Psychiatric:        Mood and Affect: Mood normal.     ED Results / Procedures / Treatments   Labs (all labs  ordered are listed, but only abnormal results are displayed) Labs Reviewed - No data to display  EKG None  Radiology No results found.  Procedures .Marland KitchenIncision and Drainage  Date/Time: 08/21/2022 10:11 PM  Performed by: Ralph Leyden A, PA-C Authorized by: Linwood Dibbles, PA-C   Consent:    Consent obtained:  Verbal   Consent given by:  Patient   Risks, benefits, and alternatives were discussed: yes     Risks discussed:  Bleeding, infection, damage to other organs, incomplete drainage and pain   Alternatives discussed:  Referral, observation, alternative treatment, delayed treatment and no treatment Universal protocol:    Procedure explained and questions answered to patient or proxy's satisfaction: yes     Relevant documents present and verified: yes     Test results available : yes     Imaging studies available: yes     Required blood products, implants, devices, and special equipment available: yes     Site/side marked: yes     Immediately prior to procedure, a time out was called: yes     Patient identity confirmed:  Verbally with patient and arm band Location:    Type:  Abscess   Size:  2cm   Location:  Neck   Neck location:  R posterior Pre-procedure details:    Skin preparation:  Povidone-iodine Sedation:    Sedation type:  None Anesthesia:    Anesthesia method:  Topical application   Topical anesthetic:  Lidocaine gel Procedure type:    Complexity:  Complex Procedure details:    Ultrasound guidance: no     Needle aspiration: no     Incision types:  Stab incision   Wound management:  Probed and deloculated, irrigated with saline and extensive cleaning   Drainage:  Purulent   Drainage amount:  Moderate   Wound treatment:  Drain placed   Packing materials:  1/4 in gauze Post-procedure details:    Procedure completion:  Tolerated well, no immediate complications     Medications Ordered in ED Medications  lidocaine-EPINEPHrine-tetracaine (LET)  topical gel (3 mLs Topical Given 08/21/22 2036)    ED Course/ Medical Decision Making/ A&P   82 year old here for evaluation of infected cyst of posterior neck.  Has been there for quite some time, previously had drained by PCP.  Areas been bothering her the last few days, started using purulent drainage.  She has no neck stiffness or neck rigidity.  I will suspicion for deep space infection.  No midline spinal tenderness.  She is afebrile, nonseptic, not ill-appearing.  She has 2 cm circular area consistent with cyst.  Has  punctate oozing of purulent material.  Will plan on draining.  See procedure note.  Patient tolerated well.  Patient with infected cyst with developing abscess.  Packing placed.  Started on antibiotics.  She understands that she needs to be seen by general surgery or dermatology for definitive management to have capsule removed from cyst.  Encouraged her to follow-up with PCP in 2 to 3 days for reevaluation.  Discussed wound care, warm compress.  She will return for new or worsening symptoms.  The patient has been appropriately medically screened and/or stabilized in the ED. I have low suspicion for any other emergent medical condition which would require further screening, evaluation or treatment in the ED or require inpatient management.  Patient is hemodynamically stable and in no acute distress.  Patient able to ambulate in department prior to ED.  Evaluation does not show acute pathology that would require ongoing or additional emergent interventions while in the emergency department or further inpatient treatment.  I have discussed the diagnosis with the patient and answered all questions.  Pain is been managed while in the emergency department and patient has no further complaints prior to discharge.  Patient is comfortable with plan discussed in room and is stable for discharge at this time.  I have discussed strict return precautions for returning to the emergency department.   Patient was encouraged to follow-up with PCP/specialist refer to at discharge.                             Medical Decision Making Amount and/or Complexity of Data Reviewed Independent Historian: friend External Data Reviewed: labs, radiology and notes.  Risk OTC drugs. Prescription drug management. Decision regarding hospitalization. Diagnosis or treatment significantly limited by social determinants of health.           Final Clinical Impression(s) / ED Diagnoses Final diagnoses:  Abscess    Rx / DC Orders ED Discharge Orders          Ordered    doxycycline (VIBRAMYCIN) 100 MG capsule  2 times daily        08/21/22 2152              Sylvio Weatherall A, PA-C 08/21/22 2214    Tegeler, Canary Brim, MD 08/21/22 2300

## 2022-08-22 ENCOUNTER — Encounter (INDEPENDENT_AMBULATORY_CARE_PROVIDER_SITE_OTHER): Payer: Medicare HMO | Admitting: Ophthalmology

## 2022-08-22 DIAGNOSIS — H2513 Age-related nuclear cataract, bilateral: Secondary | ICD-10-CM | POA: Diagnosis not present

## 2022-08-22 DIAGNOSIS — H35033 Hypertensive retinopathy, bilateral: Secondary | ICD-10-CM | POA: Diagnosis not present

## 2022-08-22 DIAGNOSIS — H353221 Exudative age-related macular degeneration, left eye, with active choroidal neovascularization: Secondary | ICD-10-CM | POA: Diagnosis not present

## 2022-08-22 DIAGNOSIS — H353112 Nonexudative age-related macular degeneration, right eye, intermediate dry stage: Secondary | ICD-10-CM | POA: Diagnosis not present

## 2022-08-22 DIAGNOSIS — I10 Essential (primary) hypertension: Secondary | ICD-10-CM | POA: Diagnosis not present

## 2022-08-22 DIAGNOSIS — H43813 Vitreous degeneration, bilateral: Secondary | ICD-10-CM

## 2022-08-24 DIAGNOSIS — L723 Sebaceous cyst: Secondary | ICD-10-CM | POA: Diagnosis not present

## 2022-08-24 DIAGNOSIS — L0211 Cutaneous abscess of neck: Secondary | ICD-10-CM | POA: Diagnosis not present

## 2022-08-24 DIAGNOSIS — I10 Essential (primary) hypertension: Secondary | ICD-10-CM | POA: Diagnosis not present

## 2022-08-30 DIAGNOSIS — E0590 Thyrotoxicosis, unspecified without thyrotoxic crisis or storm: Secondary | ICD-10-CM | POA: Diagnosis not present

## 2022-09-01 ENCOUNTER — Ambulatory Visit: Payer: Self-pay | Admitting: Surgery

## 2022-09-01 DIAGNOSIS — L723 Sebaceous cyst: Secondary | ICD-10-CM | POA: Diagnosis not present

## 2022-09-01 NOTE — H&P (Signed)
Cassandra Jones [id number]   Referring Provider:  Laurann Montana, MD   Subjective   Chief Complaint: New Consultation (Abscess to posterior neck)     History of Present Illness:    82 year old woman with history of hypertension, seizures, osteoarthritis, obesity, hypothyroidism, iron deficiency anemia, osteopenia, heart murmur, presents for evaluation of a sebaceous cyst on the posterior neck.  This had been present for about a month when it became inflamed requiring I&D first by her PCP and then in the emergency department on 08/21/2022 followed by a course of doxycycline. Still having some drainage.   Review of Systems: A complete review of systems was obtained from the patient.  I have reviewed this information and discussed as appropriate with the patient.  See HPI as well for other ROS.   Medical History: Past Medical History:  Diagnosis Date   Glaucoma (increased eye pressure)    Hyperlipidemia    Hypertension    Seizures (CMS/HHS-HCC)    Thyroid disease     There is no problem list on file for this patient.   Past Surgical History:  Procedure Laterality Date   HYSTERECTOMY       No Known Allergies  Current Outpatient Medications on File Prior to Visit  Medication Sig Dispense Refill   amLODIPine (NORVASC) 10 MG tablet Take 10 mg by mouth once daily     artificial tears with lanolin (AKWA TEARS) ophthalmic ointment Apply to eye     aspirin 81 MG EC tablet Take 81 mg by mouth once daily     donepeziL (ARICEPT) 10 MG tablet      lisinopriL-hydroCHLOROthiazide (ZESTORETIC) 20-25 mg tablet Take 1 tablet by mouth once daily     methIMAzole (TAPAZOLE) 5 MG tablet      multivitamin tablet Take 1 tablet by mouth once daily     phenytoin (DILANTIN) 100 MG ER capsule Take by mouth 3 (three) times daily     potassium chloride (KLOR-CON M10) 10 mEq ER tablet      valACYclovir (VALTREX) 500 MG tablet      No current facility-administered medications on file prior to visit.     Family History  Problem Relation Age of Onset   High blood pressure (Hypertension) Sister    Hyperlipidemia (Elevated cholesterol) Sister    Diabetes Sister    Hyperlipidemia (Elevated cholesterol) Brother    High blood pressure (Hypertension) Brother      Social History   Tobacco Use  Smoking Status Never  Smokeless Tobacco Never     Social History   Socioeconomic History   Marital status: Married  Tobacco Use   Smoking status: Never   Smokeless tobacco: Never  Vaping Use   Vaping status: Never Used  Substance and Sexual Activity   Alcohol use: Not Currently   Drug use: Never    Objective:    Vitals:   09/01/22 0928  BP: 125/72  Pulse: 88  Temp: 36.3 C (97.3 F)  SpO2: 99%  Weight: 84.8 kg (187 lb)  Height: 157.5 cm (5\' 2" )  PainSc: 0-No pain    Body mass index is 34.2 kg/m.  Gen: A&Ox3, no distress  Unlabored respirations Approximately 3 x 2 cm mobile subcutaneous cyst with central pore draining thin sebaceous cyst contents; well-healed stab site just to the right of this    Assessment and Plan:  Diagnoses and all orders for this visit:  Sebaceous cyst  Desires excision.  We discussed the procedure and risks of bleeding, infection, pain, scarring,  wound healing problems/open wound, injury to underlying structures, recurrence of the lesion.  Questions welcomed and answered to her satisfaction.  Patient wishes to proceed.   Hiral Lukasiewicz Carlye Grippe, MD

## 2022-10-03 ENCOUNTER — Encounter (INDEPENDENT_AMBULATORY_CARE_PROVIDER_SITE_OTHER): Payer: Medicare HMO | Admitting: Ophthalmology

## 2022-10-14 ENCOUNTER — Encounter (INDEPENDENT_AMBULATORY_CARE_PROVIDER_SITE_OTHER): Payer: Medicare HMO | Admitting: Ophthalmology

## 2022-10-14 DIAGNOSIS — H353221 Exudative age-related macular degeneration, left eye, with active choroidal neovascularization: Secondary | ICD-10-CM

## 2022-10-14 DIAGNOSIS — H43813 Vitreous degeneration, bilateral: Secondary | ICD-10-CM | POA: Diagnosis not present

## 2022-10-14 DIAGNOSIS — I10 Essential (primary) hypertension: Secondary | ICD-10-CM

## 2022-10-14 DIAGNOSIS — H35033 Hypertensive retinopathy, bilateral: Secondary | ICD-10-CM | POA: Diagnosis not present

## 2022-10-14 DIAGNOSIS — H353111 Nonexudative age-related macular degeneration, right eye, early dry stage: Secondary | ICD-10-CM

## 2022-10-27 ENCOUNTER — Encounter (HOSPITAL_BASED_OUTPATIENT_CLINIC_OR_DEPARTMENT_OTHER): Payer: Self-pay | Admitting: Surgery

## 2022-10-27 NOTE — Progress Notes (Signed)
Spoke w/ via phone for pre-op interview--- Cassandra Jones needs dos----  EKG and ISTAT per anesthesia       Jones results------ COVID test -----patient states asymptomatic no test needed Arrive at -------0530 NPO after MN NO Solid Food.   Med rec completed Medications to take morning of surgery -----Norvasc, Dilantin and Tapazole Diabetic medication ----- Patient instructed no nail polish to be worn day of surgery Patient instructed to bring photo id and insurance card day of surgery Patient aware to have Driver (ride ) / caregiver    for 24 hours after surgery - Daughter Cassandra Jones Patient Special Instructions ----- Pre-Op special Instructions ----- Patient verbalized understanding of instructions that were given at this phone interview. Patient denies chest pain, sob, fever, cough at the interview.   Patient tested positive for Covid 10/17/22. Patient stated she has been asymptomatic since testing positive.

## 2022-11-04 ENCOUNTER — Other Ambulatory Visit: Payer: Self-pay

## 2022-11-04 ENCOUNTER — Ambulatory Visit (HOSPITAL_BASED_OUTPATIENT_CLINIC_OR_DEPARTMENT_OTHER): Payer: Medicare HMO | Admitting: Anesthesiology

## 2022-11-04 ENCOUNTER — Ambulatory Visit (HOSPITAL_BASED_OUTPATIENT_CLINIC_OR_DEPARTMENT_OTHER)
Admission: RE | Admit: 2022-11-04 | Discharge: 2022-11-04 | Disposition: A | Discharge status: Home / Self Care | Payer: Medicare HMO | Attending: Surgery | Admitting: Surgery

## 2022-11-04 ENCOUNTER — Encounter (HOSPITAL_BASED_OUTPATIENT_CLINIC_OR_DEPARTMENT_OTHER): Payer: Self-pay | Admitting: Surgery

## 2022-11-04 ENCOUNTER — Encounter (HOSPITAL_BASED_OUTPATIENT_CLINIC_OR_DEPARTMENT_OTHER)
Admission: RE | Disposition: A | Discharge status: Home / Self Care | Payer: Self-pay | Source: Home / Self Care | Attending: Surgery

## 2022-11-04 DIAGNOSIS — M1990 Unspecified osteoarthritis, unspecified site: Secondary | ICD-10-CM | POA: Diagnosis not present

## 2022-11-04 DIAGNOSIS — R011 Cardiac murmur, unspecified: Secondary | ICD-10-CM | POA: Diagnosis not present

## 2022-11-04 DIAGNOSIS — I10 Essential (primary) hypertension: Secondary | ICD-10-CM

## 2022-11-04 DIAGNOSIS — L720 Epidermal cyst: Secondary | ICD-10-CM | POA: Diagnosis not present

## 2022-11-04 DIAGNOSIS — R569 Unspecified convulsions: Secondary | ICD-10-CM | POA: Insufficient documentation

## 2022-11-04 DIAGNOSIS — M8580 Other specified disorders of bone density and structure, unspecified site: Secondary | ICD-10-CM | POA: Diagnosis not present

## 2022-11-04 DIAGNOSIS — E0590 Thyrotoxicosis, unspecified without thyrotoxic crisis or storm: Secondary | ICD-10-CM | POA: Insufficient documentation

## 2022-11-04 DIAGNOSIS — Z6835 Body mass index (BMI) 35.0-35.9, adult: Secondary | ICD-10-CM | POA: Insufficient documentation

## 2022-11-04 DIAGNOSIS — L729 Follicular cyst of the skin and subcutaneous tissue, unspecified: Secondary | ICD-10-CM

## 2022-11-04 HISTORY — DX: Thyrotoxicosis, unspecified without thyrotoxic crisis or storm: E05.90

## 2022-11-04 HISTORY — PX: EXCISION MASS NECK: SHX6703

## 2022-11-04 LAB — POCT I-STAT, CHEM 8
BUN: 16 mg/dL (ref 8–23)
Calcium, Ion: 1.25 mmol/L (ref 1.15–1.40)
Chloride: 101 mmol/L (ref 98–111)
Creatinine, Ser: 0.8 mg/dL (ref 0.44–1.00)
Glucose, Bld: 116 mg/dL — ABNORMAL HIGH (ref 70–99)
HCT: 42 % (ref 36.0–46.0)
Hemoglobin: 14.3 g/dL (ref 12.0–15.0)
Potassium: 3.5 mmol/L (ref 3.5–5.1)
Sodium: 142 mmol/L (ref 135–145)
TCO2: 27 mmol/L (ref 22–32)

## 2022-11-04 SURGERY — EXCISION, MASS, NECK
Anesthesia: Monitor Anesthesia Care

## 2022-11-04 MED ORDER — FENTANYL CITRATE (PF) 100 MCG/2ML IJ SOLN: 25.0000 ug | INTRAMUSCULAR | Status: DC | PRN

## 2022-11-04 MED ORDER — CHLORHEXIDINE GLUCONATE 4 % EX SOLN: 60.0000 mL | Freq: Once | CUTANEOUS | Status: DC

## 2022-11-04 MED ORDER — ACETAMINOPHEN 500 MG PO TABS
ORAL_TABLET | ORAL | Status: AC
  Filled 2022-11-04: qty 2

## 2022-11-04 MED ORDER — CEFAZOLIN SODIUM-DEXTROSE 2-4 GM/100ML-% IV SOLN
2.0000 g | INTRAVENOUS | Status: AC
  Administered 2022-11-04: 2 g via INTRAVENOUS

## 2022-11-04 MED ORDER — 0.9 % SODIUM CHLORIDE (POUR BTL) OPTIME
TOPICAL | Status: DC | PRN
  Administered 2022-11-04: 500 mL

## 2022-11-04 MED ORDER — PROPOFOL 500 MG/50ML IV EMUL
INTRAVENOUS | Status: DC | PRN
  Administered 2022-11-04: 50 ug/kg/min via INTRAVENOUS

## 2022-11-04 MED ORDER — DEXMEDETOMIDINE HCL IN NACL 80 MCG/20ML IV SOLN
INTRAVENOUS | Status: DC | PRN
Start: 2022-11-04 — End: 2022-11-04
  Administered 2022-11-04 (×2): 4 ug via INTRAVENOUS

## 2022-11-04 MED ORDER — TRAMADOL HCL 50 MG PO TABS: 50.0000 mg | ORAL_TABLET | Freq: Four times a day (QID) | ORAL | 0 refills | Status: AC | PRN

## 2022-11-04 MED ORDER — PROPOFOL 10 MG/ML IV BOLUS
INTRAVENOUS | Status: DC | PRN
  Administered 2022-11-04 (×3): 10 mg via INTRAVENOUS

## 2022-11-04 MED ORDER — LACTATED RINGERS IV SOLN: INTRAVENOUS | Status: DC

## 2022-11-04 MED ORDER — CEFAZOLIN SODIUM-DEXTROSE 2-4 GM/100ML-% IV SOLN
INTRAVENOUS | Status: AC
  Filled 2022-11-04: qty 100

## 2022-11-04 MED ORDER — DROPERIDOL 2.5 MG/ML IJ SOLN: 0.6250 mg | Freq: Once | INTRAMUSCULAR | Status: DC | PRN

## 2022-11-04 MED ORDER — BUPIVACAINE-EPINEPHRINE 0.25% -1:200000 IJ SOLN
INTRAMUSCULAR | Status: DC | PRN
  Administered 2022-11-04: 3 mL

## 2022-11-04 MED ORDER — ONDANSETRON HCL 4 MG/2ML IJ SOLN
INTRAMUSCULAR | Status: DC | PRN
  Administered 2022-11-04: 4 mg via INTRAVENOUS

## 2022-11-04 MED ORDER — LIDOCAINE HCL (CARDIAC) PF 100 MG/5ML IV SOSY
PREFILLED_SYRINGE | INTRAVENOUS | Status: DC | PRN
  Administered 2022-11-04: 60 mg via INTRAVENOUS

## 2022-11-04 MED ORDER — ACETAMINOPHEN 500 MG PO TABS
1000.0000 mg | ORAL_TABLET | ORAL | Status: AC
  Administered 2022-11-04: 1000 mg via ORAL

## 2022-11-04 SURGICAL SUPPLY — 45 items
ADH SKN CLS APL DERMABOND .7 (GAUZE/BANDAGES/DRESSINGS) ×1
APL PRP STRL LF DISP 70% ISPRP (MISCELLANEOUS) ×1
BLADE SURG 15 STRL LF DISP TIS (BLADE) ×2 IMPLANT
BLADE SURG 15 STRL SS (BLADE) ×1
BRIEF MESH DISP LRG (UNDERPADS AND DIAPERS) IMPLANT
CHLORAPREP W/TINT 26 (MISCELLANEOUS) IMPLANT
COVER BACK TABLE 60X90IN (DRAPES) ×2 IMPLANT
COVER MAYO STAND STRL (DRAPES) ×2 IMPLANT
DERMABOND ADVANCED .7 DNX12 (GAUZE/BANDAGES/DRESSINGS) IMPLANT
DRAPE LAPAROTOMY 100X72 PEDS (DRAPES) IMPLANT
DRAPE LAPAROTOMY TRNSV 102X78 (DRAPES) IMPLANT
DRAPE UTILITY XL STRL (DRAPES) ×2 IMPLANT
ELECT REM PT RETURN 9FT ADLT (ELECTROSURGICAL) ×1
ELECTRODE REM PT RTRN 9FT ADLT (ELECTROSURGICAL) ×2 IMPLANT
GAUZE 4X4 16PLY ~~LOC~~+RFID DBL (SPONGE) ×2 IMPLANT
GAUZE PAD ABD 8X10 STRL (GAUZE/BANDAGES/DRESSINGS) IMPLANT
GAUZE SPONGE 4X4 12PLY STRL (GAUZE/BANDAGES/DRESSINGS) ×2 IMPLANT
GLOVE BIO SURGEON STRL SZ 6 (GLOVE) ×2 IMPLANT
GLOVE INDICATOR 6.5 STRL GRN (GLOVE) ×2 IMPLANT
GOWN STRL REUS W/TWL LRG LVL3 (GOWN DISPOSABLE) ×2 IMPLANT
KIT SIGMOIDOSCOPE (SET/KITS/TRAYS/PACK) IMPLANT
KIT TURNOVER CYSTO (KITS) ×2 IMPLANT
NDL HYPO 22X1.5 SAFETY MO (MISCELLANEOUS) IMPLANT
NDL HYPO 25X1 1.5 SAFETY (NEEDLE) IMPLANT
NEEDLE HYPO 22X1.5 SAFETY MO (MISCELLANEOUS) ×1
NEEDLE HYPO 25X1 1.5 SAFETY (NEEDLE)
PACK BASIN DAY SURGERY FS (CUSTOM PROCEDURE TRAY) ×2 IMPLANT
PENCIL SMOKE EVACUATOR (MISCELLANEOUS) ×2 IMPLANT
SLEEVE SCD COMPRESS KNEE MED (STOCKING) ×2 IMPLANT
SPONGE T-LAP 18X18 ~~LOC~~+RFID (SPONGE) IMPLANT
SPONGE T-LAP 4X18 ~~LOC~~+RFID (SPONGE) IMPLANT
STAPLER VISISTAT 35W (STAPLE) IMPLANT
SUCTION TUBE FRAZIER 10FR DISP (SUCTIONS) IMPLANT
SUT CHROMIC 3 0 SH 27 (SUTURE) IMPLANT
SUT MNCRL AB 4-0 PS2 18 (SUTURE) ×2 IMPLANT
SUT VIC AB 2-0 SH 27 (SUTURE)
SUT VIC AB 2-0 SH 27XBRD (SUTURE) IMPLANT
SUT VIC AB 3-0 SH 27 (SUTURE) ×1
SUT VIC AB 3-0 SH 27X BRD (SUTURE) ×2 IMPLANT
SYR BULB IRRIG 60ML STRL (SYRINGE) ×2 IMPLANT
SYR CONTROL 10ML LL (SYRINGE) ×2 IMPLANT
TOWEL OR 17X24 6PK STRL BLUE (TOWEL DISPOSABLE) ×2 IMPLANT
TRAY DSU PREP LF (CUSTOM PROCEDURE TRAY) IMPLANT
TUBE CONNECTING 12X1/4 (SUCTIONS) ×2 IMPLANT
YANKAUER SUCT BULB TIP NO VENT (SUCTIONS) ×2 IMPLANT

## 2022-11-04 NOTE — Discharge Instructions (Addendum)
GENERAL SURGERY: POST OP INSTRUCTIONS  EAT Gradually transition to a high fiber diet with a fiber supplement over the next few weeks after discharge.  Start with a pureed / full liquid diet (see below)  WALK Walk an hour a day (cumulative, not all at once).  Control your pain to do that.    CONTROL PAIN Control pain so that you can walk, sleep, tolerate sneezing/coughing, go up/down stairs.  HAVE A BOWEL MOVEMENT DAILY Keep your bowels regular to avoid problems.  OK to try a laxative to override constipation.  OK to use an antidairrheal to slow down diarrhea.  Call if not better after 2 tries  CALL IF YOU HAVE PROBLEMS/CONCERNS Call if you are still struggling despite following these instructions. Call if you have concerns not answered by these instructions    DIET: Follow a light bland diet & liquids the first 24 hours after arrival home, such as soup, liquids, starches, etc.  Be sure to drink plenty of fluids.  Quickly advance to a usual solid diet within a few days.  Avoid fast food or heavy meals as your are more likely to get nauseated or have irregular bowels.     Take your usually prescribed home medications unless otherwise directed. PAIN CONTROL: Pain is best controlled by a usual combination of three different methods TOGETHER: Ice/Heat Over the counter pain medication Prescription pain medication Most patients will experience some swelling and bruising around the incisions.  Ice packs or heating pads (30-60 minutes up to 6 times a day) will help. Use ice for the first few days to help decrease swelling and bruising, then switch to heat to help relax tight/sore spots and speed recovery.  Some people prefer to use ice alone, heat alone, alternating between ice & heat.  Experiment to what works for you.  Swelling and bruising can take several weeks to resolve.   It is helpful to take an over-the-counter pain medication regularly for the first few weeks.  Choose one of the  following that works best for you: Naproxen (Aleve, etc)  Two 220mg  tabs twice a day OR Ibuprofen (Advil, etc) Three 200mg  tabs four times a day (every meal & bedtime) AND Acetaminophen (Tylenol, etc) 500-650mg  four times a day (every meal & bedtime) A  prescription for pain medication (such as oxycodone, hydrocodone, etc) should be given to you upon discharge.  Take your pain medication as prescribed.  If you are having problems/concerns with the prescription medicine (does not control pain, nausea, vomiting, rash, itching, etc), please call us [phone] to see if we need to switch you to a different pain medicine that will work better for you and/or control your side effect better. If you need a refill on your pain medication, please contact your pharmacy.  They will contact our office to request authorization. Prescriptions will not be filled after 5 pm or on week-ends. Avoid getting constipated.  Between the surgery and the pain medications, it is common to experience some constipation.  Increasing fluid intake and taking a fiber supplement (such as Metamucil, Citrucel, FiberCon, MiraLax, etc) 1-2 times a day regularly will usually help prevent this problem from occurring.  A mild laxative (prune juice, Milk of Magnesia, MiraLax, etc) should be taken according to package directions if there are no bowel movements after 48 hours.    Wash / shower every day, starting 2 days after surgery.  You may shower over the skin glue which is waterproof.  Continue to shower over  incision(s) after the dressing is off.  Let the soap and water run over the incision and pat dry.  No rubbing, scrubbing, lotions or ointments to incision.  Do not soak or submerge incision.  Skin glue will flake off after 1-2 weeks.  You may leave the incision open to air.  You may replace a dressing/Band-Aid to cover the incision for comfort if you wish.    ACTIVITIES as tolerated:   You may resume regular (light) daily  activities beginning the next day--such as daily self-care, walking, climbing stairs--gradually increasing activities as tolerated.  If you can walk 30 minutes without difficulty, it is safe to try more intense activity such as jogging, treadmill, bicycling, low-impact aerobics, swimming, etc. Save the most intensive and strenuous activity for last such as sit-ups, heavy lifting, contact sports, etc  Refrain from any heavy lifting or straining until you are off narcotics for pain control.   DO NOT PUSH THROUGH PAIN.  Let pain be your guide: If it hurts to do something, don't do it.  Pain is your body warning you to avoid that activity for another week until the pain goes down. You may drive when you are no longer taking prescription pain medication, you can comfortably wear a seatbelt, and you can safely maneuver your car and apply brakes. You may have sexual intercourse when it is comfortable.  FOLLOW UP in our office Please call [facility] at [phone] to set up an appointment to see your surgeon in the office for a follow-up appointment approximately 2-3 weeks after your surgery. Make sure that you call for this appointment the day you arrive home to insure a convenient appointment time. 9. IF YOU HAVE DISABILITY OR FAMILY LEAVE FORMS, BRING THEM TO THE OFFICE FOR PROCESSING.  DO NOT GIVE THEM TO YOUR DOCTOR.   WHEN TO CALL us [phone]: Poor pain control Reactions / problems with new medications (rash/itching, nausea, etc)  Fever over 101.5 F (38.5 C) Worsening swelling or bruising Continued bleeding from incision. Increased pain, redness, or drainage from the incision Difficulty breathing / swallowing   The clinic staff is available to answer your questions during regular business hours (8:30am-5pm).  Please don't hesitate to call and ask to speak to one of our nurses for clinical concerns.   If you have a medical emergency, go to the nearest emergency room or call 911.  A surgeon from  [facility] is always on call at the [facility], [place] [address], [place], [place]  [address] ? MAIN: [phone] ? TOLL FREE: [phone] ?  FAX [phone] [email or web address]   Post Anesthesia Home Care Instructions  Activity: Get plenty of rest for the remainder of the day. A responsible individual must stay with you for 24 hours following the procedure.  For the next 24 hours, DO NOT: -Drive a car -Advertising copywriter -Drink alcoholic beverages -Take any medication unless instructed by your physician -Make any legal decisions or sign important papers.  Meals: Start with liquid foods such as gelatin or soup. Progress to regular foods as tolerated. Avoid greasy, spicy, heavy foods. If nausea and/or vomiting occur, drink only clear liquids until the nausea and/or vomiting subsides. Call your physician if vomiting continues.  Special Instructions/Symptoms: Your throat may feel dry or sore from the anesthesia or the breathing tube placed in your throat during surgery. If this causes discomfort, gargle with warm salt water. The discomfort should disappear within 24  hours.     No acetaminophen/Tylenol until after 5:30 pm today if needed.

## 2022-11-04 NOTE — Op Note (Signed)
Operative Note  Cassandra Jones  [id number]  [id number]  11/04/2022   Surgeon: Phylliss Blakes MD FACS   Procedure performed: Excision of previously infected sebaceous cyst, posterior neck, 1 x 1 x 1 cm   Preop diagnosis: Previously infected sebaceous cyst Post-op diagnosis/intraop findings: Same   Specimens: sebaceous cyst Retained items: no  EBL: minimal Complications: none   Description of procedure: After confirming informed consent the patient was taken to the operating room and placed in the right lateral decubitus position on the operating room table where MAC was initiated, preoperative antibiotics were administered, SCDs applied, and a formal timeout was performed.  The posterior neck was prepped, draped in the usual sterile fashion and then after infiltration with local, an elliptical incision was made encompassing the scar from prior incision and drainage as well as the draining pore.  The subcutaneous tissues were dissected with cautery and the entire cyst wall was excised with a small margin of healthy appearing subcutaneous fat.  This was handed off for pathology.  Hemostasis was ensured within the wound with cautery and the incision was closed with a deep dermal 3-0 Vicryl followed by interrupted subcuticular 4-0 Monocryl and Dermabond.  The patient was then awakened and taken to PACU in stable condition.    All counts were correct at the completion of the case.

## 2022-11-04 NOTE — Anesthesia Preprocedure Evaluation (Addendum)
Anesthesia Evaluation  Patient identified by MRN, date of birth, ID band Patient awake    Reviewed: Allergy & Precautions, NPO status , Patient's Chart, lab work & pertinent test results  Airway Mallampati: II  TM Distance: >3 FB Neck ROM: Full    Dental  (+) Dental Advisory Given, Teeth Intact, Loose, Missing, Poor Dentition,    Pulmonary neg pulmonary ROS   Pulmonary exam normal breath sounds clear to auscultation       Cardiovascular hypertension, Pt. on medications Normal cardiovascular exam+ Valvular Problems/Murmurs  Rhythm:Regular Rate:Normal     Neuro/Psych Seizures -, Well Controlled,     GI/Hepatic Neg liver ROS,,,  Endo/Other   Hyperthyroidism Morbid obesity  Renal/GU negative Renal ROS     Musculoskeletal negative musculoskeletal ROS (+)    Abdominal  (+) + obese  Peds  Hematology negative hematology ROS (+)   Anesthesia Other Findings   Reproductive/Obstetrics                             Anesthesia Physical Anesthesia Plan  ASA: 3  Anesthesia Plan: MAC   Post-op Pain Management: Minimal or no pain anticipated   Induction:   PONV Risk Score and Plan: Ondansetron, Dexamethasone and Treatment may vary due to age or medical condition  Airway Management Planned:   Additional Equipment:   Intra-op Plan:   Post-operative Plan:   Informed Consent: I have reviewed the patients History and Physical, chart, labs and discussed the procedure including the risks, benefits and alternatives for the proposed anesthesia with the patient or authorized representative who has indicated his/her understanding and acceptance.     Dental advisory given  Plan Discussed with: CRNA  Anesthesia Plan Comments:         Anesthesia Quick Evaluation

## 2022-11-04 NOTE — H&P (Signed)
Cassandra Jones [id number]   Referring Provider:  Laurann Montana, MD   Subjective   Chief Complaint: New Consultation (Abscess to posterior neck)     History of Present Illness:    82 year old woman with history of hypertension, seizures, osteoarthritis, obesity, hypothyroidism, iron deficiency anemia, osteopenia, heart murmur, presents for evaluation of a sebaceous cyst on the posterior neck.  This had been present for about a month when it became inflamed requiring I&D first by her PCP and then in the emergency department on 08/21/2022 followed by a course of doxycycline. Still having some drainage.   Review of Systems: A complete review of systems was obtained from the patient.  I have reviewed this information and discussed as appropriate with the patient.  See HPI as well for other ROS.   Medical History: Past Medical History:  Diagnosis Date   Glaucoma (increased eye pressure)    Hyperlipidemia    Hypertension    Seizures (CMS/HHS-HCC)    Thyroid disease     There is no problem list on file for this patient.   Past Surgical History:  Procedure Laterality Date   HYSTERECTOMY       No Known Allergies  Current Outpatient Medications on File Prior to Visit  Medication Sig Dispense Refill   amLODIPine (NORVASC) 10 MG tablet Take 10 mg by mouth once daily     artificial tears with lanolin (AKWA TEARS) ophthalmic ointment Apply to eye     aspirin 81 MG EC tablet Take 81 mg by mouth once daily     donepeziL (ARICEPT) 10 MG tablet      lisinopriL-hydroCHLOROthiazide (ZESTORETIC) 20-25 mg tablet Take 1 tablet by mouth once daily     methIMAzole (TAPAZOLE) 5 MG tablet      multivitamin tablet Take 1 tablet by mouth once daily     phenytoin (DILANTIN) 100 MG ER capsule Take by mouth 3 (three) times daily     potassium chloride (KLOR-CON M10) 10 mEq ER tablet      valACYclovir (VALTREX) 500 MG tablet      No current facility-administered medications on file prior to visit.     Family History  Problem Relation Age of Onset   High blood pressure (Hypertension) Sister    Hyperlipidemia (Elevated cholesterol) Sister    Diabetes Sister    Hyperlipidemia (Elevated cholesterol) Brother    High blood pressure (Hypertension) Brother      Social History   Tobacco Use  Smoking Status Never  Smokeless Tobacco Never     Social History   Socioeconomic History   Marital status: Married  Tobacco Use   Smoking status: Never   Smokeless tobacco: Never  Vaping Use   Vaping status: Never Used  Substance and Sexual Activity   Alcohol use: Not Currently   Drug use: Never    Objective:    Vitals:   09/01/22 0928  BP: 125/72  Pulse: 88  Temp: 36.3 C (97.3 F)  SpO2: 99%  Weight: 84.8 kg (187 lb)  Height: 157.5 cm (5\' 2" )  PainSc: 0-No pain    Body mass index is 34.2 kg/m.  Gen: A&Ox3, no distress  Unlabored respirations Approximately 3 x 2 cm mobile subcutaneous cyst with central pore draining thin sebaceous cyst contents; well-healed stab site just to the right of this    Assessment and Plan:  Diagnoses and all orders for this visit:  Sebaceous cyst  Desires excision.  We discussed the procedure and risks of bleeding, infection, pain, scarring,  wound healing problems/open wound, injury to underlying structures, recurrence of the lesion.  Questions welcomed and answered to her satisfaction.  Patient wishes to proceed.   Hiral Lukasiewicz Carlye Grippe, MD

## 2022-11-04 NOTE — Transfer of Care (Addendum)
Immediate Anesthesia Transfer of Care Note  Patient: Cassandra Jones  Procedure(s) Performed: Procedure(s) (LRB): EXCISION OF SEBACEOUS CYST  POSTERIOR NECK (N/A)  Patient Location: PACU  Anesthesia Type: MAC  Level of Consciousness: awake, sedated, patient cooperative and responds to stimulation  Airway & Oxygen Therapy: Patient Spontanous Breathing on RA   Post-op Assessment: Report given to PACU RN, Post -op Vital signs reviewed and stable and Patient moving all extremities  Post vital signs: Reviewed and stable  Complications: No apparent anesthesia complications

## 2022-11-04 NOTE — Anesthesia Procedure Notes (Signed)
Procedure Name: MAC Date/Time: 11/04/2022 12:20 PM  Performed by: Jessica Priest, CRNAPre-anesthesia Checklist: Timeout performed, Patient identified, Emergency Drugs available, Suction available and Patient being monitored Patient Re-evaluated:Patient Re-evaluated prior to induction Oxygen Delivery Method: Simple face mask Preoxygenation: Pre-oxygenation with 100% oxygen Induction Type: IV induction Placement Confirmation: breath sounds checked- equal and bilateral, CO2 detector and positive ETCO2

## 2022-11-07 ENCOUNTER — Encounter (HOSPITAL_BASED_OUTPATIENT_CLINIC_OR_DEPARTMENT_OTHER): Payer: Self-pay | Admitting: Surgery

## 2022-11-07 LAB — SURGICAL PATHOLOGY

## 2022-11-07 NOTE — Anesthesia Postprocedure Evaluation (Signed)
Anesthesia Post Note  Patient: Cassandra Jones  Procedure(s) Performed: EXCISION OF SEBACEOUS CYST  POSTERIOR NECK     Patient location during evaluation: PACU Anesthesia Type: MAC Level of consciousness: awake and alert Pain management: pain level controlled Vital Signs Assessment: post-procedure vital signs reviewed and stable Respiratory status: spontaneous breathing Cardiovascular status: stable Anesthetic complications: no   No notable events documented.  Last Vitals:  Vitals:   11/04/22 1328 11/04/22 1400  BP: 137/79 (!) 144/81  Pulse: 64 75  Resp: 15 16  Temp:  (!) 36.3 C  SpO2: 96% 97%    Last Pain:  Vitals:   11/04/22 1400  TempSrc:   PainSc: 0-No pain                 Lewie Loron

## 2022-11-18 ENCOUNTER — Encounter (INDEPENDENT_AMBULATORY_CARE_PROVIDER_SITE_OTHER): Payer: Medicare HMO | Admitting: Ophthalmology

## 2022-11-18 DIAGNOSIS — H353111 Nonexudative age-related macular degeneration, right eye, early dry stage: Secondary | ICD-10-CM

## 2022-11-18 DIAGNOSIS — I10 Essential (primary) hypertension: Secondary | ICD-10-CM

## 2022-11-18 DIAGNOSIS — H43813 Vitreous degeneration, bilateral: Secondary | ICD-10-CM

## 2022-11-18 DIAGNOSIS — H353221 Exudative age-related macular degeneration, left eye, with active choroidal neovascularization: Secondary | ICD-10-CM

## 2022-11-18 DIAGNOSIS — H35033 Hypertensive retinopathy, bilateral: Secondary | ICD-10-CM

## 2022-12-15 DIAGNOSIS — Z09 Encounter for follow-up examination after completed treatment for conditions other than malignant neoplasm: Secondary | ICD-10-CM | POA: Diagnosis not present

## 2022-12-23 ENCOUNTER — Encounter (INDEPENDENT_AMBULATORY_CARE_PROVIDER_SITE_OTHER): Payer: Medicare HMO | Admitting: Ophthalmology

## 2022-12-23 DIAGNOSIS — H353221 Exudative age-related macular degeneration, left eye, with active choroidal neovascularization: Secondary | ICD-10-CM

## 2022-12-23 DIAGNOSIS — H35033 Hypertensive retinopathy, bilateral: Secondary | ICD-10-CM | POA: Diagnosis not present

## 2022-12-23 DIAGNOSIS — I10 Essential (primary) hypertension: Secondary | ICD-10-CM

## 2022-12-23 DIAGNOSIS — H353111 Nonexudative age-related macular degeneration, right eye, early dry stage: Secondary | ICD-10-CM

## 2022-12-23 DIAGNOSIS — H43813 Vitreous degeneration, bilateral: Secondary | ICD-10-CM | POA: Diagnosis not present

## 2022-12-26 DIAGNOSIS — E0590 Thyrotoxicosis, unspecified without thyrotoxic crisis or storm: Secondary | ICD-10-CM | POA: Diagnosis not present

## 2022-12-30 DIAGNOSIS — Z23 Encounter for immunization: Secondary | ICD-10-CM | POA: Diagnosis not present

## 2023-01-02 DIAGNOSIS — D509 Iron deficiency anemia, unspecified: Secondary | ICD-10-CM | POA: Diagnosis not present

## 2023-01-02 DIAGNOSIS — E0590 Thyrotoxicosis, unspecified without thyrotoxic crisis or storm: Secondary | ICD-10-CM | POA: Diagnosis not present

## 2023-01-02 DIAGNOSIS — L409 Psoriasis, unspecified: Secondary | ICD-10-CM | POA: Diagnosis not present

## 2023-01-02 DIAGNOSIS — I10 Essential (primary) hypertension: Secondary | ICD-10-CM | POA: Diagnosis not present

## 2023-01-02 DIAGNOSIS — Z23 Encounter for immunization: Secondary | ICD-10-CM | POA: Diagnosis not present

## 2023-01-18 DIAGNOSIS — H353221 Exudative age-related macular degeneration, left eye, with active choroidal neovascularization: Secondary | ICD-10-CM | POA: Diagnosis not present

## 2023-01-18 DIAGNOSIS — H04123 Dry eye syndrome of bilateral lacrimal glands: Secondary | ICD-10-CM | POA: Diagnosis not present

## 2023-01-18 DIAGNOSIS — H353111 Nonexudative age-related macular degeneration, right eye, early dry stage: Secondary | ICD-10-CM | POA: Diagnosis not present

## 2023-01-18 DIAGNOSIS — H43813 Vitreous degeneration, bilateral: Secondary | ICD-10-CM | POA: Diagnosis not present

## 2023-01-18 DIAGNOSIS — H10413 Chronic giant papillary conjunctivitis, bilateral: Secondary | ICD-10-CM | POA: Diagnosis not present

## 2023-01-18 DIAGNOSIS — H40013 Open angle with borderline findings, low risk, bilateral: Secondary | ICD-10-CM | POA: Diagnosis not present

## 2023-01-18 DIAGNOSIS — H25811 Combined forms of age-related cataract, right eye: Secondary | ICD-10-CM | POA: Diagnosis not present

## 2023-01-27 ENCOUNTER — Encounter (INDEPENDENT_AMBULATORY_CARE_PROVIDER_SITE_OTHER): Payer: Medicare HMO | Admitting: Ophthalmology

## 2023-01-27 DIAGNOSIS — I10 Essential (primary) hypertension: Secondary | ICD-10-CM | POA: Diagnosis not present

## 2023-01-27 DIAGNOSIS — H353111 Nonexudative age-related macular degeneration, right eye, early dry stage: Secondary | ICD-10-CM

## 2023-01-27 DIAGNOSIS — H353221 Exudative age-related macular degeneration, left eye, with active choroidal neovascularization: Secondary | ICD-10-CM

## 2023-01-27 DIAGNOSIS — H35033 Hypertensive retinopathy, bilateral: Secondary | ICD-10-CM | POA: Diagnosis not present

## 2023-01-27 DIAGNOSIS — H43813 Vitreous degeneration, bilateral: Secondary | ICD-10-CM | POA: Diagnosis not present

## 2023-02-16 DIAGNOSIS — H2511 Age-related nuclear cataract, right eye: Secondary | ICD-10-CM | POA: Diagnosis not present

## 2023-02-21 DIAGNOSIS — Z0000 Encounter for general adult medical examination without abnormal findings: Secondary | ICD-10-CM | POA: Diagnosis not present

## 2023-02-21 DIAGNOSIS — M8588 Other specified disorders of bone density and structure, other site: Secondary | ICD-10-CM | POA: Diagnosis not present

## 2023-02-21 DIAGNOSIS — G40909 Epilepsy, unspecified, not intractable, without status epilepticus: Secondary | ICD-10-CM | POA: Diagnosis not present

## 2023-02-21 DIAGNOSIS — G3184 Mild cognitive impairment, so stated: Secondary | ICD-10-CM | POA: Diagnosis not present

## 2023-02-21 DIAGNOSIS — E785 Hyperlipidemia, unspecified: Secondary | ICD-10-CM | POA: Diagnosis not present

## 2023-02-21 DIAGNOSIS — I10 Essential (primary) hypertension: Secondary | ICD-10-CM | POA: Diagnosis not present

## 2023-02-21 DIAGNOSIS — H9193 Unspecified hearing loss, bilateral: Secondary | ICD-10-CM | POA: Diagnosis not present

## 2023-02-21 DIAGNOSIS — E0590 Thyrotoxicosis, unspecified without thyrotoxic crisis or storm: Secondary | ICD-10-CM | POA: Diagnosis not present

## 2023-02-21 DIAGNOSIS — Z23 Encounter for immunization: Secondary | ICD-10-CM | POA: Diagnosis not present

## 2023-02-27 ENCOUNTER — Encounter (INDEPENDENT_AMBULATORY_CARE_PROVIDER_SITE_OTHER): Payer: Medicare HMO | Admitting: Ophthalmology

## 2023-02-27 DIAGNOSIS — H35033 Hypertensive retinopathy, bilateral: Secondary | ICD-10-CM | POA: Diagnosis not present

## 2023-02-27 DIAGNOSIS — I10 Essential (primary) hypertension: Secondary | ICD-10-CM | POA: Diagnosis not present

## 2023-02-27 DIAGNOSIS — H43813 Vitreous degeneration, bilateral: Secondary | ICD-10-CM

## 2023-02-27 DIAGNOSIS — H353111 Nonexudative age-related macular degeneration, right eye, early dry stage: Secondary | ICD-10-CM

## 2023-02-27 DIAGNOSIS — H2512 Age-related nuclear cataract, left eye: Secondary | ICD-10-CM

## 2023-02-27 DIAGNOSIS — H353221 Exudative age-related macular degeneration, left eye, with active choroidal neovascularization: Secondary | ICD-10-CM

## 2023-03-07 DIAGNOSIS — E0590 Thyrotoxicosis, unspecified without thyrotoxic crisis or storm: Secondary | ICD-10-CM | POA: Diagnosis not present

## 2023-04-03 ENCOUNTER — Ambulatory Visit: Payer: Medicare HMO | Attending: Family Medicine | Admitting: Audiologist

## 2023-04-03 ENCOUNTER — Encounter (INDEPENDENT_AMBULATORY_CARE_PROVIDER_SITE_OTHER): Payer: Medicare HMO | Admitting: Ophthalmology

## 2023-04-03 DIAGNOSIS — H353221 Exudative age-related macular degeneration, left eye, with active choroidal neovascularization: Secondary | ICD-10-CM

## 2023-04-03 DIAGNOSIS — H35033 Hypertensive retinopathy, bilateral: Secondary | ICD-10-CM | POA: Diagnosis not present

## 2023-04-03 DIAGNOSIS — I10 Essential (primary) hypertension: Secondary | ICD-10-CM

## 2023-04-03 DIAGNOSIS — H2512 Age-related nuclear cataract, left eye: Secondary | ICD-10-CM

## 2023-04-03 DIAGNOSIS — H353111 Nonexudative age-related macular degeneration, right eye, early dry stage: Secondary | ICD-10-CM

## 2023-04-03 DIAGNOSIS — H43813 Vitreous degeneration, bilateral: Secondary | ICD-10-CM | POA: Diagnosis not present

## 2023-04-17 DIAGNOSIS — E0590 Thyrotoxicosis, unspecified without thyrotoxic crisis or storm: Secondary | ICD-10-CM | POA: Diagnosis not present

## 2023-05-01 ENCOUNTER — Ambulatory Visit: Payer: Medicare HMO | Attending: Family Medicine | Admitting: Audiologist

## 2023-05-05 ENCOUNTER — Encounter (INDEPENDENT_AMBULATORY_CARE_PROVIDER_SITE_OTHER): Payer: Medicare HMO | Admitting: Ophthalmology

## 2023-05-05 DIAGNOSIS — H43813 Vitreous degeneration, bilateral: Secondary | ICD-10-CM

## 2023-05-05 DIAGNOSIS — H353221 Exudative age-related macular degeneration, left eye, with active choroidal neovascularization: Secondary | ICD-10-CM | POA: Diagnosis not present

## 2023-05-05 DIAGNOSIS — I10 Essential (primary) hypertension: Secondary | ICD-10-CM

## 2023-05-05 DIAGNOSIS — H353111 Nonexudative age-related macular degeneration, right eye, early dry stage: Secondary | ICD-10-CM | POA: Diagnosis not present

## 2023-05-05 DIAGNOSIS — H35033 Hypertensive retinopathy, bilateral: Secondary | ICD-10-CM | POA: Diagnosis not present

## 2023-05-31 DIAGNOSIS — H20021 Recurrent acute iridocyclitis, right eye: Secondary | ICD-10-CM | POA: Diagnosis not present

## 2023-06-06 DIAGNOSIS — H20021 Recurrent acute iridocyclitis, right eye: Secondary | ICD-10-CM | POA: Diagnosis not present

## 2023-06-09 ENCOUNTER — Encounter (INDEPENDENT_AMBULATORY_CARE_PROVIDER_SITE_OTHER): Admitting: Ophthalmology

## 2023-06-09 DIAGNOSIS — H43813 Vitreous degeneration, bilateral: Secondary | ICD-10-CM | POA: Diagnosis not present

## 2023-06-09 DIAGNOSIS — H353221 Exudative age-related macular degeneration, left eye, with active choroidal neovascularization: Secondary | ICD-10-CM

## 2023-06-09 DIAGNOSIS — H353112 Nonexudative age-related macular degeneration, right eye, intermediate dry stage: Secondary | ICD-10-CM | POA: Diagnosis not present

## 2023-06-09 DIAGNOSIS — I10 Essential (primary) hypertension: Secondary | ICD-10-CM | POA: Diagnosis not present

## 2023-06-09 DIAGNOSIS — H35033 Hypertensive retinopathy, bilateral: Secondary | ICD-10-CM

## 2023-07-10 DIAGNOSIS — E0590 Thyrotoxicosis, unspecified without thyrotoxic crisis or storm: Secondary | ICD-10-CM | POA: Diagnosis not present

## 2023-07-14 ENCOUNTER — Encounter (INDEPENDENT_AMBULATORY_CARE_PROVIDER_SITE_OTHER): Admitting: Ophthalmology

## 2023-07-14 DIAGNOSIS — H35033 Hypertensive retinopathy, bilateral: Secondary | ICD-10-CM

## 2023-07-14 DIAGNOSIS — H43813 Vitreous degeneration, bilateral: Secondary | ICD-10-CM | POA: Diagnosis not present

## 2023-07-14 DIAGNOSIS — I10 Essential (primary) hypertension: Secondary | ICD-10-CM

## 2023-07-14 DIAGNOSIS — H353221 Exudative age-related macular degeneration, left eye, with active choroidal neovascularization: Secondary | ICD-10-CM

## 2023-07-14 DIAGNOSIS — H353111 Nonexudative age-related macular degeneration, right eye, early dry stage: Secondary | ICD-10-CM | POA: Diagnosis not present

## 2023-07-14 DIAGNOSIS — H59031 Cystoid macular edema following cataract surgery, right eye: Secondary | ICD-10-CM

## 2023-08-04 ENCOUNTER — Encounter (INDEPENDENT_AMBULATORY_CARE_PROVIDER_SITE_OTHER): Admitting: Ophthalmology

## 2023-08-04 ENCOUNTER — Other Ambulatory Visit (INDEPENDENT_AMBULATORY_CARE_PROVIDER_SITE_OTHER): Payer: Self-pay | Admitting: Ophthalmology

## 2023-08-04 DIAGNOSIS — H353221 Exudative age-related macular degeneration, left eye, with active choroidal neovascularization: Secondary | ICD-10-CM | POA: Diagnosis not present

## 2023-08-04 DIAGNOSIS — I10 Essential (primary) hypertension: Secondary | ICD-10-CM | POA: Diagnosis not present

## 2023-08-04 DIAGNOSIS — H35033 Hypertensive retinopathy, bilateral: Secondary | ICD-10-CM | POA: Diagnosis not present

## 2023-08-04 DIAGNOSIS — H59031 Cystoid macular edema following cataract surgery, right eye: Secondary | ICD-10-CM

## 2023-08-04 DIAGNOSIS — H4601 Optic papillitis, right eye: Secondary | ICD-10-CM | POA: Diagnosis not present

## 2023-08-04 DIAGNOSIS — H43813 Vitreous degeneration, bilateral: Secondary | ICD-10-CM

## 2023-08-05 LAB — SPECIMEN STATUS REPORT

## 2023-08-07 LAB — C-REACTIVE PROTEIN: CRP: 1 mg/L (ref 0–10)

## 2023-08-07 LAB — SEDIMENTATION RATE: Sed Rate: 12 mm/h (ref 0–40)

## 2023-08-08 DIAGNOSIS — H353111 Nonexudative age-related macular degeneration, right eye, early dry stage: Secondary | ICD-10-CM | POA: Diagnosis not present

## 2023-08-08 DIAGNOSIS — H20021 Recurrent acute iridocyclitis, right eye: Secondary | ICD-10-CM | POA: Diagnosis not present

## 2023-08-18 ENCOUNTER — Encounter (INDEPENDENT_AMBULATORY_CARE_PROVIDER_SITE_OTHER): Admitting: Ophthalmology

## 2023-08-21 ENCOUNTER — Encounter (INDEPENDENT_AMBULATORY_CARE_PROVIDER_SITE_OTHER): Admitting: Ophthalmology

## 2023-08-21 DIAGNOSIS — H3581 Retinal edema: Secondary | ICD-10-CM

## 2023-08-21 DIAGNOSIS — I10 Essential (primary) hypertension: Secondary | ICD-10-CM | POA: Diagnosis not present

## 2023-08-21 DIAGNOSIS — H4710 Unspecified papilledema: Secondary | ICD-10-CM | POA: Diagnosis not present

## 2023-08-21 DIAGNOSIS — H353111 Nonexudative age-related macular degeneration, right eye, early dry stage: Secondary | ICD-10-CM | POA: Diagnosis not present

## 2023-08-21 DIAGNOSIS — H353221 Exudative age-related macular degeneration, left eye, with active choroidal neovascularization: Secondary | ICD-10-CM

## 2023-08-21 DIAGNOSIS — H35033 Hypertensive retinopathy, bilateral: Secondary | ICD-10-CM

## 2023-08-21 DIAGNOSIS — H2512 Age-related nuclear cataract, left eye: Secondary | ICD-10-CM

## 2023-08-21 DIAGNOSIS — H43813 Vitreous degeneration, bilateral: Secondary | ICD-10-CM | POA: Diagnosis not present

## 2023-08-22 DIAGNOSIS — H20022 Recurrent acute iridocyclitis, left eye: Secondary | ICD-10-CM | POA: Diagnosis not present

## 2023-08-22 DIAGNOSIS — Z116 Encounter for screening for other protozoal diseases and helminthiases: Secondary | ICD-10-CM | POA: Diagnosis not present

## 2023-08-22 DIAGNOSIS — H20021 Recurrent acute iridocyclitis, right eye: Secondary | ICD-10-CM | POA: Diagnosis not present

## 2023-08-22 DIAGNOSIS — H15119 Episcleritis periodica fugax, unspecified eye: Secondary | ICD-10-CM | POA: Diagnosis not present

## 2023-08-24 DIAGNOSIS — D509 Iron deficiency anemia, unspecified: Secondary | ICD-10-CM | POA: Diagnosis not present

## 2023-08-24 DIAGNOSIS — L409 Psoriasis, unspecified: Secondary | ICD-10-CM | POA: Diagnosis not present

## 2023-08-24 DIAGNOSIS — I10 Essential (primary) hypertension: Secondary | ICD-10-CM | POA: Diagnosis not present

## 2023-08-24 DIAGNOSIS — E0590 Thyrotoxicosis, unspecified without thyrotoxic crisis or storm: Secondary | ICD-10-CM | POA: Diagnosis not present

## 2023-09-18 ENCOUNTER — Encounter (INDEPENDENT_AMBULATORY_CARE_PROVIDER_SITE_OTHER): Admitting: Ophthalmology

## 2023-09-18 DIAGNOSIS — I10 Essential (primary) hypertension: Secondary | ICD-10-CM

## 2023-09-18 DIAGNOSIS — H43813 Vitreous degeneration, bilateral: Secondary | ICD-10-CM | POA: Diagnosis not present

## 2023-09-18 DIAGNOSIS — H353122 Nonexudative age-related macular degeneration, left eye, intermediate dry stage: Secondary | ICD-10-CM | POA: Diagnosis not present

## 2023-09-18 DIAGNOSIS — H35033 Hypertensive retinopathy, bilateral: Secondary | ICD-10-CM

## 2023-09-18 DIAGNOSIS — H59031 Cystoid macular edema following cataract surgery, right eye: Secondary | ICD-10-CM

## 2023-09-22 DIAGNOSIS — H10413 Chronic giant papillary conjunctivitis, bilateral: Secondary | ICD-10-CM | POA: Diagnosis not present

## 2023-09-22 DIAGNOSIS — H40013 Open angle with borderline findings, low risk, bilateral: Secondary | ICD-10-CM | POA: Diagnosis not present

## 2023-09-22 DIAGNOSIS — H25812 Combined forms of age-related cataract, left eye: Secondary | ICD-10-CM | POA: Diagnosis not present

## 2023-09-22 DIAGNOSIS — Z961 Presence of intraocular lens: Secondary | ICD-10-CM | POA: Diagnosis not present

## 2023-09-22 DIAGNOSIS — H04123 Dry eye syndrome of bilateral lacrimal glands: Secondary | ICD-10-CM | POA: Diagnosis not present

## 2023-09-22 DIAGNOSIS — H20021 Recurrent acute iridocyclitis, right eye: Secondary | ICD-10-CM | POA: Diagnosis not present

## 2023-10-16 ENCOUNTER — Encounter (INDEPENDENT_AMBULATORY_CARE_PROVIDER_SITE_OTHER): Admitting: Ophthalmology

## 2023-10-20 DIAGNOSIS — H04123 Dry eye syndrome of bilateral lacrimal glands: Secondary | ICD-10-CM | POA: Diagnosis not present

## 2023-10-20 DIAGNOSIS — H20021 Recurrent acute iridocyclitis, right eye: Secondary | ICD-10-CM | POA: Diagnosis not present

## 2023-10-20 DIAGNOSIS — H25812 Combined forms of age-related cataract, left eye: Secondary | ICD-10-CM | POA: Diagnosis not present

## 2023-10-20 DIAGNOSIS — H10413 Chronic giant papillary conjunctivitis, bilateral: Secondary | ICD-10-CM | POA: Diagnosis not present

## 2023-10-20 DIAGNOSIS — H40013 Open angle with borderline findings, low risk, bilateral: Secondary | ICD-10-CM | POA: Diagnosis not present

## 2023-10-23 DIAGNOSIS — Z23 Encounter for immunization: Secondary | ICD-10-CM | POA: Diagnosis not present

## 2023-10-25 ENCOUNTER — Encounter (INDEPENDENT_AMBULATORY_CARE_PROVIDER_SITE_OTHER): Admitting: Ophthalmology

## 2023-10-25 DIAGNOSIS — H59031 Cystoid macular edema following cataract surgery, right eye: Secondary | ICD-10-CM

## 2023-10-25 DIAGNOSIS — H4601 Optic papillitis, right eye: Secondary | ICD-10-CM

## 2023-10-25 DIAGNOSIS — I10 Essential (primary) hypertension: Secondary | ICD-10-CM | POA: Diagnosis not present

## 2023-10-25 DIAGNOSIS — H2512 Age-related nuclear cataract, left eye: Secondary | ICD-10-CM

## 2023-10-25 DIAGNOSIS — H353221 Exudative age-related macular degeneration, left eye, with active choroidal neovascularization: Secondary | ICD-10-CM | POA: Diagnosis not present

## 2023-10-25 DIAGNOSIS — H35033 Hypertensive retinopathy, bilateral: Secondary | ICD-10-CM

## 2023-10-26 DIAGNOSIS — H40013 Open angle with borderline findings, low risk, bilateral: Secondary | ICD-10-CM | POA: Diagnosis not present

## 2023-10-26 DIAGNOSIS — H04123 Dry eye syndrome of bilateral lacrimal glands: Secondary | ICD-10-CM | POA: Diagnosis not present

## 2023-10-26 DIAGNOSIS — H10413 Chronic giant papillary conjunctivitis, bilateral: Secondary | ICD-10-CM | POA: Diagnosis not present

## 2023-10-26 DIAGNOSIS — E0590 Thyrotoxicosis, unspecified without thyrotoxic crisis or storm: Secondary | ICD-10-CM | POA: Diagnosis not present

## 2023-10-26 DIAGNOSIS — H25812 Combined forms of age-related cataract, left eye: Secondary | ICD-10-CM | POA: Diagnosis not present

## 2023-10-26 DIAGNOSIS — H20021 Recurrent acute iridocyclitis, right eye: Secondary | ICD-10-CM | POA: Diagnosis not present

## 2023-11-14 DIAGNOSIS — H20021 Recurrent acute iridocyclitis, right eye: Secondary | ICD-10-CM | POA: Diagnosis not present

## 2023-11-29 ENCOUNTER — Encounter (INDEPENDENT_AMBULATORY_CARE_PROVIDER_SITE_OTHER): Admitting: Ophthalmology

## 2023-11-29 DIAGNOSIS — H35033 Hypertensive retinopathy, bilateral: Secondary | ICD-10-CM | POA: Diagnosis not present

## 2023-11-29 DIAGNOSIS — H43813 Vitreous degeneration, bilateral: Secondary | ICD-10-CM | POA: Diagnosis not present

## 2023-11-29 DIAGNOSIS — I10 Essential (primary) hypertension: Secondary | ICD-10-CM

## 2023-11-29 DIAGNOSIS — H4601 Optic papillitis, right eye: Secondary | ICD-10-CM | POA: Diagnosis not present

## 2023-11-29 DIAGNOSIS — H59031 Cystoid macular edema following cataract surgery, right eye: Secondary | ICD-10-CM | POA: Diagnosis not present

## 2023-12-27 ENCOUNTER — Encounter (INDEPENDENT_AMBULATORY_CARE_PROVIDER_SITE_OTHER): Admitting: Ophthalmology

## 2023-12-27 DIAGNOSIS — H25812 Combined forms of age-related cataract, left eye: Secondary | ICD-10-CM | POA: Diagnosis not present

## 2023-12-27 DIAGNOSIS — H47011 Ischemic optic neuropathy, right eye: Secondary | ICD-10-CM | POA: Diagnosis not present

## 2023-12-27 DIAGNOSIS — H04123 Dry eye syndrome of bilateral lacrimal glands: Secondary | ICD-10-CM | POA: Diagnosis not present

## 2023-12-27 DIAGNOSIS — Z961 Presence of intraocular lens: Secondary | ICD-10-CM | POA: Diagnosis not present

## 2023-12-27 DIAGNOSIS — H10413 Chronic giant papillary conjunctivitis, bilateral: Secondary | ICD-10-CM | POA: Diagnosis not present

## 2023-12-27 DIAGNOSIS — H40013 Open angle with borderline findings, low risk, bilateral: Secondary | ICD-10-CM | POA: Diagnosis not present

## 2023-12-27 DIAGNOSIS — H20021 Recurrent acute iridocyclitis, right eye: Secondary | ICD-10-CM | POA: Diagnosis not present

## 2023-12-28 ENCOUNTER — Encounter (INDEPENDENT_AMBULATORY_CARE_PROVIDER_SITE_OTHER): Admitting: Ophthalmology

## 2024-01-05 DIAGNOSIS — E785 Hyperlipidemia, unspecified: Secondary | ICD-10-CM | POA: Diagnosis not present

## 2024-01-05 DIAGNOSIS — H353290 Exudative age-related macular degeneration, unspecified eye, stage unspecified: Secondary | ICD-10-CM | POA: Diagnosis not present

## 2024-01-05 DIAGNOSIS — I10 Essential (primary) hypertension: Secondary | ICD-10-CM | POA: Diagnosis not present

## 2024-01-05 DIAGNOSIS — M1990 Unspecified osteoarthritis, unspecified site: Secondary | ICD-10-CM | POA: Diagnosis not present

## 2024-01-05 DIAGNOSIS — Z7982 Long term (current) use of aspirin: Secondary | ICD-10-CM | POA: Diagnosis not present

## 2024-01-05 DIAGNOSIS — Z9841 Cataract extraction status, right eye: Secondary | ICD-10-CM | POA: Diagnosis not present

## 2024-01-05 DIAGNOSIS — L409 Psoriasis, unspecified: Secondary | ICD-10-CM | POA: Diagnosis not present

## 2024-01-05 DIAGNOSIS — H1030 Unspecified acute conjunctivitis, unspecified eye: Secondary | ICD-10-CM | POA: Diagnosis not present

## 2024-01-05 DIAGNOSIS — E0590 Thyrotoxicosis, unspecified without thyrotoxic crisis or storm: Secondary | ICD-10-CM | POA: Diagnosis not present

## 2024-01-05 DIAGNOSIS — M069 Rheumatoid arthritis, unspecified: Secondary | ICD-10-CM | POA: Diagnosis not present

## 2024-01-05 DIAGNOSIS — G309 Alzheimer's disease, unspecified: Secondary | ICD-10-CM | POA: Diagnosis not present

## 2024-01-05 DIAGNOSIS — Z6832 Body mass index (BMI) 32.0-32.9, adult: Secondary | ICD-10-CM | POA: Diagnosis not present

## 2024-01-05 DIAGNOSIS — E669 Obesity, unspecified: Secondary | ICD-10-CM | POA: Diagnosis not present

## 2024-01-05 DIAGNOSIS — H269 Unspecified cataract: Secondary | ICD-10-CM | POA: Diagnosis not present

## 2024-01-05 DIAGNOSIS — F02A0 Dementia in other diseases classified elsewhere, mild, without behavioral disturbance, psychotic disturbance, mood disturbance, and anxiety: Secondary | ICD-10-CM | POA: Diagnosis not present

## 2024-01-05 DIAGNOSIS — G40909 Epilepsy, unspecified, not intractable, without status epilepticus: Secondary | ICD-10-CM | POA: Diagnosis not present

## 2024-01-15 ENCOUNTER — Encounter (INDEPENDENT_AMBULATORY_CARE_PROVIDER_SITE_OTHER): Admitting: Ophthalmology

## 2024-01-15 DIAGNOSIS — H35033 Hypertensive retinopathy, bilateral: Secondary | ICD-10-CM | POA: Diagnosis not present

## 2024-01-15 DIAGNOSIS — H43813 Vitreous degeneration, bilateral: Secondary | ICD-10-CM | POA: Diagnosis not present

## 2024-01-15 DIAGNOSIS — H353221 Exudative age-related macular degeneration, left eye, with active choroidal neovascularization: Secondary | ICD-10-CM | POA: Diagnosis not present

## 2024-01-15 DIAGNOSIS — I10 Essential (primary) hypertension: Secondary | ICD-10-CM

## 2024-01-15 DIAGNOSIS — H4600 Optic papillitis, unspecified eye: Secondary | ICD-10-CM | POA: Diagnosis not present

## 2024-01-15 DIAGNOSIS — H59031 Cystoid macular edema following cataract surgery, right eye: Secondary | ICD-10-CM

## 2024-02-12 ENCOUNTER — Encounter (INDEPENDENT_AMBULATORY_CARE_PROVIDER_SITE_OTHER): Admitting: Ophthalmology

## 2024-02-12 DIAGNOSIS — H35033 Hypertensive retinopathy, bilateral: Secondary | ICD-10-CM | POA: Diagnosis not present

## 2024-02-12 DIAGNOSIS — H59031 Cystoid macular edema following cataract surgery, right eye: Secondary | ICD-10-CM | POA: Diagnosis not present

## 2024-02-12 DIAGNOSIS — H43813 Vitreous degeneration, bilateral: Secondary | ICD-10-CM | POA: Diagnosis not present

## 2024-02-12 DIAGNOSIS — I10 Essential (primary) hypertension: Secondary | ICD-10-CM

## 2024-02-12 DIAGNOSIS — H353122 Nonexudative age-related macular degeneration, left eye, intermediate dry stage: Secondary | ICD-10-CM | POA: Diagnosis not present

## 2024-03-11 ENCOUNTER — Encounter (INDEPENDENT_AMBULATORY_CARE_PROVIDER_SITE_OTHER): Admitting: Ophthalmology
